# Patient Record
Sex: Female | Born: 1985 | Race: White | Hispanic: No | Smoking: Current every day smoker
Health system: Southern US, Community
[De-identification: ages and names within clinical notes are randomized; demographics above are authoritative.]

## PROBLEM LIST (undated history)

## (undated) DIAGNOSIS — G44209 Tension-type headache, unspecified, not intractable: Secondary | ICD-10-CM

---
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
## 2006-03-31 ENCOUNTER — Emergency Department (HOSPITAL_COMMUNITY): Admission: EM | Admit: 2006-03-31 | Discharge: 2006-03-31 | Payer: Self-pay | Admitting: Emergency Medicine

## 2006-11-14 ENCOUNTER — Ambulatory Visit: Payer: Self-pay | Admitting: Family Medicine

## 2006-12-19 ENCOUNTER — Ambulatory Visit: Payer: Self-pay | Admitting: Family Medicine

## 2007-04-24 ENCOUNTER — Encounter: Payer: Self-pay | Admitting: Family Medicine

## 2007-04-24 ENCOUNTER — Ambulatory Visit: Payer: Self-pay | Admitting: Family Medicine

## 2007-05-08 ENCOUNTER — Ambulatory Visit: Payer: Self-pay | Admitting: Obstetrics & Gynecology

## 2007-06-15 ENCOUNTER — Emergency Department (HOSPITAL_COMMUNITY): Admission: EM | Admit: 2007-06-15 | Discharge: 2007-06-15 | Payer: Self-pay | Admitting: Emergency Medicine

## 2007-07-10 ENCOUNTER — Ambulatory Visit: Payer: Self-pay | Admitting: Family Medicine

## 2007-07-10 ENCOUNTER — Encounter (INDEPENDENT_AMBULATORY_CARE_PROVIDER_SITE_OTHER): Payer: Self-pay | Admitting: Internal Medicine

## 2007-07-10 DIAGNOSIS — R519 Headache, unspecified: Secondary | ICD-10-CM | POA: Insufficient documentation

## 2007-07-10 DIAGNOSIS — F411 Generalized anxiety disorder: Secondary | ICD-10-CM | POA: Insufficient documentation

## 2007-07-10 DIAGNOSIS — K2990 Gastroduodenitis, unspecified, without bleeding: Secondary | ICD-10-CM

## 2007-07-10 DIAGNOSIS — N3000 Acute cystitis without hematuria: Secondary | ICD-10-CM

## 2007-07-10 DIAGNOSIS — K2970 Gastritis, unspecified, without bleeding: Secondary | ICD-10-CM | POA: Insufficient documentation

## 2007-07-10 DIAGNOSIS — R51 Headache: Secondary | ICD-10-CM

## 2007-07-10 DIAGNOSIS — F329 Major depressive disorder, single episode, unspecified: Secondary | ICD-10-CM

## 2007-07-11 LAB — CONVERTED CEMR LAB
Basophils Absolute: 0 10*3/uL (ref 0.0–0.1)
Basophils Relative: 0.6 % (ref 0.0–1.0)
Eosinophils Absolute: 0.1 10*3/uL (ref 0.0–0.6)
Eosinophils Relative: 0.8 % (ref 0.0–5.0)
H Pylori IgG: NEGATIVE
HCT: 38.9 % (ref 36.0–46.0)
Hemoglobin: 13.3 g/dL (ref 12.0–15.0)
Lymphocytes Relative: 33.5 % (ref 12.0–46.0)
MCHC: 34.1 g/dL (ref 30.0–36.0)
MCV: 87.2 fL (ref 78.0–100.0)
Monocytes Absolute: 0.6 10*3/uL (ref 0.2–0.7)
Monocytes Relative: 8.7 % (ref 3.0–11.0)
Neutro Abs: 3.9 10*3/uL (ref 1.4–7.7)
Neutrophils Relative %: 56.4 % (ref 43.0–77.0)
Platelets: 288 10*3/uL (ref 150–400)
RBC: 4.46 M/uL (ref 3.87–5.11)
RDW: 12 % (ref 11.5–14.6)
WBC: 6.9 10*3/uL (ref 4.5–10.5)

## 2007-07-20 ENCOUNTER — Ambulatory Visit: Payer: Self-pay | Admitting: Family Medicine

## 2007-07-20 DIAGNOSIS — B029 Zoster without complications: Secondary | ICD-10-CM | POA: Insufficient documentation

## 2007-08-07 ENCOUNTER — Ambulatory Visit: Payer: Self-pay | Admitting: Family Medicine

## 2007-08-14 DIAGNOSIS — J020 Streptococcal pharyngitis: Secondary | ICD-10-CM

## 2007-08-16 ENCOUNTER — Ambulatory Visit: Payer: Self-pay | Admitting: Internal Medicine

## 2007-08-16 LAB — CONVERTED CEMR LAB: Rapid Strep: POSITIVE

## 2007-10-03 ENCOUNTER — Ambulatory Visit: Payer: Self-pay | Admitting: Family Medicine

## 2007-10-03 DIAGNOSIS — K120 Recurrent oral aphthae: Secondary | ICD-10-CM | POA: Insufficient documentation

## 2007-10-03 DIAGNOSIS — I7300 Raynaud's syndrome without gangrene: Secondary | ICD-10-CM | POA: Insufficient documentation

## 2007-10-16 ENCOUNTER — Ambulatory Visit: Payer: Self-pay | Admitting: Family Medicine

## 2007-11-12 ENCOUNTER — Ambulatory Visit: Payer: Self-pay | Admitting: Family Medicine

## 2007-11-12 ENCOUNTER — Ambulatory Visit (HOSPITAL_COMMUNITY): Admission: RE | Admit: 2007-11-12 | Discharge: 2007-11-12 | Payer: Self-pay | Admitting: Family Medicine

## 2007-12-04 ENCOUNTER — Ambulatory Visit: Payer: Self-pay | Admitting: Family Medicine

## 2008-02-08 ENCOUNTER — Ambulatory Visit: Payer: Self-pay | Admitting: Family Medicine

## 2008-02-08 LAB — CONVERTED CEMR LAB
Beta hcg, urine, semiquantitative: NEGATIVE
Bilirubin Urine: NEGATIVE
Blood in Urine, dipstick: NEGATIVE
Glucose, Urine, Semiquant: NEGATIVE
Ketones, urine, test strip: NEGATIVE
Nitrite: NEGATIVE
Specific Gravity, Urine: 1.025
Urobilinogen, UA: 0.2
WBC Urine, dipstick: NEGATIVE
pH: 5

## 2008-02-09 ENCOUNTER — Encounter (INDEPENDENT_AMBULATORY_CARE_PROVIDER_SITE_OTHER): Payer: Self-pay | Admitting: Internal Medicine

## 2008-05-21 ENCOUNTER — Ambulatory Visit: Payer: Self-pay | Admitting: Family Medicine

## 2009-06-22 ENCOUNTER — Emergency Department (HOSPITAL_COMMUNITY): Admission: EM | Admit: 2009-06-22 | Discharge: 2009-06-22 | Payer: Self-pay | Admitting: Emergency Medicine

## 2009-06-24 ENCOUNTER — Inpatient Hospital Stay (HOSPITAL_COMMUNITY): Admission: AD | Admit: 2009-06-24 | Discharge: 2009-06-24 | Payer: Self-pay | Admitting: Obstetrics & Gynecology

## 2010-03-03 ENCOUNTER — Emergency Department: Payer: Self-pay | Admitting: Emergency Medicine

## 2010-12-09 LAB — POCT URINALYSIS DIP (DEVICE)
Bilirubin Urine: NEGATIVE
Glucose, UA: NEGATIVE mg/dL
Hgb urine dipstick: NEGATIVE
Ketones, ur: NEGATIVE mg/dL
Nitrite: NEGATIVE
Protein, ur: NEGATIVE mg/dL
Specific Gravity, Urine: 1.02 (ref 1.005–1.030)
Urobilinogen, UA: 0.2 mg/dL (ref 0.0–1.0)
pH: 7.5 (ref 5.0–8.0)

## 2010-12-09 LAB — DIFFERENTIAL
Basophils Relative: 1 % (ref 0–1)
Lymphocytes Relative: 26 % (ref 12–46)
Monocytes Absolute: 0.7 10*3/uL (ref 0.1–1.0)
Monocytes Relative: 11 % (ref 3–12)
Neutro Abs: 4.2 10*3/uL (ref 1.7–7.7)

## 2010-12-09 LAB — GC/CHLAMYDIA PROBE AMP, GENITAL: GC Probe Amp, Genital: NEGATIVE

## 2010-12-09 LAB — URINALYSIS, ROUTINE W REFLEX MICROSCOPIC
Bilirubin Urine: NEGATIVE
Hgb urine dipstick: NEGATIVE
Specific Gravity, Urine: <1.005 — ABNORMAL LOW (ref 1.005–1.030)
pH: 6 (ref 5.0–8.0)

## 2010-12-09 LAB — CBC
HCT: 41.9 % (ref 36.0–46.0)
Hemoglobin: 14.3 g/dL (ref 12.0–15.0)
MCHC: 34 g/dL (ref 30.0–36.0)
RBC: 4.75 MIL/uL (ref 3.87–5.11)

## 2010-12-09 LAB — WET PREP, GENITAL: Clue Cells Wet Prep HPF POC: NONE SEEN

## 2011-01-18 NOTE — Assessment & Plan Note (Signed)
NAME:  TYTIANNA, GREENLEY NO.:  [id number]   MEDICAL RECORD NO.:  [id number]          PATIENT TYPE:  POB   LOCATION:  [facility] at [facility]         FACILITY:  [facility]   PHYSICIAN:  Elsie Lincoln, MD      DATE OF BIRTH:  March 28, 1986   DATE OF SERVICE:  08/07/2007                                  CLINIC NOTE   The patient is a 25 year old female who presents for follow-up pelvic  pain.  The pelvic pain is during her menses which she finds  excruciating. She has now changed to Fem-con. However, she is not on  continuous. She does take the placebo that she has a period.  She does  have a with a pain with defecation.  It sounds like she may be mildly  constipated. We talked about Colace, docusate sodium and  Citrucel/Fibercon.  This would help soften her bowel movements and maybe  aid in relieving some of her pelvic pain.  She does not have any urinary  symptoms.  She does have any pain with intercourse. Again, this pain is  mostly during menses.   PHYSICAL EXAM:  The vagina is clean.  Cervix closed.  Uterus very small,  anteverted, mobile. The patient has a little more tenderness left than  right on the adnexa. However, there is no discrete mass felt.   ASSESSMENT/PLAN:  A 25 year old female with pelvic pain.  The patient  questionably has endometriosis.  I would treat her with continuous OCPs.  She did have endometriosis flow.  We had talked about the risk of  surgery versus just trying continuous OCPs. She agrees to try this for 2  months.  If she does not have relief during these two months, we will go  ahead schedule a diagnostic laparoscopy with myself or Dr. Shawnie Pons. She  understands the risk of this procedure bleeding, infection, damage to  intra-abdominal organs. The patient is to come back in 2 months for  follow-up.           ______________________________  Elsie Lincoln, MD     KL/MEDQ  D:  08/07/2007  T:  08/07/2007  Job:  [id number]
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
## 2011-01-21 NOTE — Assessment & Plan Note (Signed)
Angela Hale, LESH                ACCOUNT NO.:  [id number]   MEDICAL RECORD NO.:  [id number]          PATIENT TYPE:  POB   LOCATION:  [facility] at [facility]         FACILITY:  [facility]   PHYSICIAN:  Tinnie Gens, MD        DATE OF BIRTH:  1986-08-12   DATE OF SERVICE:  12/29/2006                                  CLINIC NOTE   CHIEF COMPLAINT:  Followup.   HISTORY OF PRESENT ILLNESS:  The patient is a 25 year old nulligravida  who was seen previously March 11 for pelvic pain.  She had a right  ovarian cyst at the time and some abnormal uterine bleeding.  She was  switched from a low dose contraceptive to Ortho-Cept to 30 mcg pill,  monophasic pill.  She had been on Ortho-Cept for approximately 1 month.  She has had 9 days of bleeding, mostly brownish discharge.  She does not  really have a full cycle yet and she is on day 2 of her placebos.  This  is her first pack.  She has decreased pelvic pain.  She has been using  Darvocet, which has helped.   VITAL SIGNS:  Vitals as noted in the chart.  Blood pressure is up mildly  at 140/78, weight 134 and stable.  GENERAL:  She is well developed, well nourished female in no acute  distress.  GU: Normal external female genitalia.  Vagina pink and rugated.  BUS is  normal.  Cervix was nulliparous without lesion.  Pink-brown discharge is  noted.  Quick transvaginal ultrasound was done, which revealed normal  ovaries bilaterally and normal appearing uterus with a slightly  thickened endometrium at the fundus.  She appears to have a normal  endometrial stripe, it was felt was just because her cycle is on its  way.   IMPRESSION:  1. History of right-sided ovarian cyst, resolved now.  2. Intrauterine bleeding, have tried different pill pack and will try      it for 1 more month.  If it fails to work we will switch her to      something else, may be a candidate for a NuvaRing or a 137      monophasic pill.           ______________________________  Tinnie Gens, MD     TP/MEDQ  D:  12/19/2006  T:  12/19/2006  Job:  [id number]

## 2011-05-30 LAB — CBC
Hemoglobin: 13.5
MCHC: 35.1
MCV: 84.9
RBC: 4.52

## 2013-10-19 ENCOUNTER — Encounter (HOSPITAL_COMMUNITY): Payer: Self-pay | Admitting: Emergency Medicine

## 2013-10-19 ENCOUNTER — Emergency Department (HOSPITAL_COMMUNITY)
Admission: EM | Admit: 2013-10-19 | Discharge: 2013-10-19 | Disposition: A | Discharge status: Home / Self Care | Payer: Self-pay | Attending: Emergency Medicine | Admitting: Emergency Medicine

## 2013-10-19 ENCOUNTER — Emergency Department (HOSPITAL_COMMUNITY): Payer: Self-pay

## 2013-10-19 DIAGNOSIS — N939 Abnormal uterine and vaginal bleeding, unspecified: Secondary | ICD-10-CM

## 2013-10-19 DIAGNOSIS — F17200 Nicotine dependence, unspecified, uncomplicated: Secondary | ICD-10-CM | POA: Insufficient documentation

## 2013-10-19 DIAGNOSIS — Z7982 Long term (current) use of aspirin: Secondary | ICD-10-CM | POA: Insufficient documentation

## 2013-10-19 DIAGNOSIS — Z3202 Encounter for pregnancy test, result negative: Secondary | ICD-10-CM | POA: Insufficient documentation

## 2013-10-19 DIAGNOSIS — N898 Other specified noninflammatory disorders of vagina: Secondary | ICD-10-CM | POA: Insufficient documentation

## 2013-10-19 LAB — POCT PREGNANCY, URINE: PREG TEST UR: NEGATIVE

## 2013-10-19 LAB — URINALYSIS, ROUTINE W REFLEX MICROSCOPIC
BILIRUBIN URINE: NEGATIVE
Glucose, UA: NEGATIVE mg/dL
KETONES UR: NEGATIVE mg/dL
NITRITE: NEGATIVE
Protein, ur: NEGATIVE mg/dL
SPECIFIC GRAVITY, URINE: 1.004 — AB (ref 1.005–1.030)
UROBILINOGEN UA: 0.2 mg/dL (ref 0.0–1.0)
pH: 6 (ref 5.0–8.0)

## 2013-10-19 LAB — POCT I-STAT, CHEM 8
BUN: 6 mg/dL (ref 6–23)
CALCIUM ION: 1.28 mmol/L — AB (ref 1.12–1.23)
CHLORIDE: 101 meq/L (ref 96–112)
CREATININE: 0.7 mg/dL (ref 0.50–1.10)
Glucose, Bld: 69 mg/dL — ABNORMAL LOW (ref 70–99)
HCT: 50 % — ABNORMAL HIGH (ref 36.0–46.0)
Hemoglobin: 17 g/dL — ABNORMAL HIGH (ref 12.0–15.0)
Potassium: 3.6 mEq/L — ABNORMAL LOW (ref 3.7–5.3)
SODIUM: 142 meq/L (ref 137–147)
TCO2: 27 mmol/L (ref 0–100)

## 2013-10-19 LAB — CBC
HCT: 46.5 % — ABNORMAL HIGH (ref 36.0–46.0)
Hemoglobin: 16.1 g/dL — ABNORMAL HIGH (ref 12.0–15.0)
MCH: 29.1 pg (ref 26.0–34.0)
MCHC: 34.6 g/dL (ref 30.0–36.0)
MCV: 84.1 fL (ref 78.0–100.0)
PLATELETS: 332 10*3/uL (ref 150–400)
RBC: 5.53 MIL/uL — AB (ref 3.87–5.11)
RDW: 12.9 % (ref 11.5–15.5)
WBC: 9.7 10*3/uL (ref 4.0–10.5)

## 2013-10-19 LAB — URINE MICROSCOPIC-ADD ON

## 2013-10-19 LAB — WET PREP, GENITAL
CLUE CELLS WET PREP: NONE SEEN
TRICH WET PREP: NONE SEEN
WBC WET PREP: NONE SEEN
YEAST WET PREP: NONE SEEN

## 2013-10-19 NOTE — Discharge Instructions (Signed)
Abnormal Uterine Bleeding Abnormal uterine bleeding means bleeding from the vagina that is not your normal menstrual period. This can be:  Bleeding or spotting between periods.  Bleeding after sex (sexual intercourse).  Bleeding that is heavier or more than normal.  Periods that last longer than usual.  Bleeding after menopause. There are many problems that may cause this. Treatment will depend on the cause of the bleeding. Any kind of bleeding that is not normal should be reviewed by your doctor.  HOME CARE Watch your condition for any changes. These actions may lessen any discomfort you are having:  Do not use tampons or douches as told by your doctor.  Change your pads often. You should get regular pelvic exams and Pap tests. Keep all appointments for tests as told by your doctor. GET HELP IF:  You are bleeding for more than 1 week.  You feel dizzy at times. GET HELP RIGHT AWAY IF:   You pass out.  You have to change pads every 15 to 30 minutes.  You have belly pain.  You have a fever.  You become sweaty or weak.  You are passing large blood clots from the vagina.  You feel sick to your stomach (nauseous) and throw up (vomit). MAKE SURE YOU:  Understand these instructions.  Will watch your condition.  Will get help right away if you are not doing well or get worse. Document Released: 06/19/2009 Document Revised: 06/12/2013 Document Reviewed: 03/21/2013 [employer] Patient Information 2014 [employer].  

## 2013-10-19 NOTE — ED Provider Notes (Signed)
CSN: [id number]     Arrival date & time 10/19/13  1452 History   First MD Initiated Contact with Patient 10/19/13 1516     Chief Complaint  Patient presents with  . Pelvic Pain  . Vaginal Bleeding     (Consider location/radiation/quality/duration/timing/severity/associated sxs/prior Treatment) HPI Comments: Patient is a 28 year old G0P0 female who presents to the emergency department complaining of vaginal bleeding and pelvic pain x2 days. Patient states she has had intermittent spotting for the past few months since starting on the Depo shot, however last night she began to have severe vaginal bleeding. States she has been using one tampon every 30 minutes with a large amount of blood, she has noticed "dime size" clots. Admits to associated pelvic pain that she describes as "radiating across her pelvis". No aggravating or alleviating factors. Denies vaginal discharge, dyspareunia, increased urinary frequency, urgency, dysuria, fever or chills, lightheadedness or weakness. States she's been occasionally nauseated. No vomiting. She is sexually active with one partner. She cannot recall when her last menstrual cycle was since she has not had a normal period since beginning depo. Recently had an abnormal Pap-smear done by the health dept, however pt never followed up on this.  Patient is a 28 y.o. female presenting with pelvic pain and vaginal bleeding. The history is provided by the patient.  Pelvic Pain  Vaginal Bleeding   History reviewed. No pertinent past medical history. History reviewed. No pertinent past surgical history. No family history on file. History  Substance Use Topics  . Smoking status: Current Every Day Smoker -- 0.25 packs/day for 5 years    Types: Cigarettes  . Smokeless tobacco: Never Used  . Alcohol Use: Yes     Comment: Socially    OB History   Grav Para Term Preterm Abortions TAB SAB Ect Mult Living                 Review of Systems  Genitourinary: Positive  for vaginal bleeding and pelvic pain.  All other systems reviewed and are negative.      Allergies  Sumatriptan  Home Medications   Current Outpatient Rx  Name  Route  Sig  Dispense  Refill  . aspirin 81 MG tablet   Oral   Take 81 mg by mouth daily.         . medroxyPROGESTERone (DEPO-PROVERA) 150 MG/ML injection   Intramuscular   Inject 150 mg into the muscle every 3 (three) months.          BP 140/104  Pulse 76  Temp(Src) 98.2 F (36.8 C) (Oral)  Resp 18  SpO2 100% Physical Exam  Nursing note and vitals reviewed. Constitutional: She is oriented to person, place, and time. She appears well-developed and well-nourished. No distress.  HENT:  Head: Normocephalic and atraumatic.  Mouth/Throat: Oropharynx is clear and moist.  Eyes: Conjunctivae are normal.  Neck: Normal range of motion. Neck supple.  Cardiovascular: Normal rate, regular rhythm, normal heart sounds and intact distal pulses.   Pulmonary/Chest: Effort normal and breath sounds normal.  Abdominal: Soft. Normal appearance and bowel sounds are normal. She exhibits no distension and no mass. There is tenderness (mild). There is no rigidity, no rebound and no guarding.    No peritoneal signs.  Genitourinary: Uterus is tender. Cervix exhibits no motion tenderness, no discharge and no friability. Right adnexum displays tenderness (mild). Right adnexum displays no mass and no fullness. Left adnexum displays no mass, no tenderness and no fullness. There is bleeding around  the vagina. No tenderness around the vagina. No signs of injury around the vagina. No vaginal discharge found.  Musculoskeletal: Normal range of motion. She exhibits no edema.  Neurological: She is alert and oriented to person, place, and time.  Skin: Skin is warm and dry. She is not diaphoretic.  Psychiatric: She has a normal mood and affect. Her behavior is normal.    ED Course  Procedures (including critical care time) Labs Review Labs  Reviewed  CBC - Abnormal; Notable for the following:    RBC 5.53 (*)    Hemoglobin 16.1 (*)    HCT 46.5 (*)    All other components within normal limits  URINALYSIS, ROUTINE W REFLEX MICROSCOPIC - Abnormal; Notable for the following:    Specific Gravity, Urine 1.004 (*)    Hgb urine dipstick LARGE (*)    Leukocytes, UA TRACE (*)    All other components within normal limits  POCT I-STAT, CHEM 8 - Abnormal; Notable for the following:    Potassium 3.6 (*)    Glucose, Bld 69 (*)    Calcium, Ion 1.28 (*)    Hemoglobin 17.0 (*)    HCT 50.0 (*)    All other components within normal limits  WET PREP, GENITAL  GC/CHLAMYDIA PROBE AMP  URINE MICROSCOPIC-ADD ON  POCT PREGNANCY, URINE   Imaging Review US Transvaginal Non-ob  10/19/2013   CLINICAL DATA:  Pelvic pain and vaginal bleeding  EXAM: TRANSABDOMINAL AND TRANSVAGINAL ULTRASOUND OF PELVIS  TECHNIQUE: Both transabdominal and transvaginal ultrasound examinations of the pelvis were performed. Transabdominal technique was performed for global imaging of the pelvis including uterus, ovaries, adnexal regions, and pelvic cul-de-sac. It was necessary to proceed with endovaginal exam following the transabdominal exam to visualize the endometrium and ovaries.  COMPARISON:  US TRANSVAGINAL NON-OB dated 06/24/2009  FINDINGS: Uterus  Measurements: 6.7 x 3.7 x 3.0 cm, anteverted, anteflexed. No fibroids or other mass visualized.  Endometrium  Thickness: 3 mm.  No focal abnormality visualized.  Right ovary  Measurements: 4.6 x 1.8 x 1.6 cm. Normal appearance/no adnexal mass.  Left ovary  Measurements: 3.7 x 1.6 x 1.1 cm. Normal appearance/no adnexal mass.  Other findings  Trace free fluid is noted.  IMPRESSION: Normal exam.   Electronically Signed   By: Christiana Pellant M.D.   On: 10/19/2013 17:24   US Pelvis Complete  10/19/2013   CLINICAL DATA:  Pelvic pain and vaginal bleeding  EXAM: TRANSABDOMINAL AND TRANSVAGINAL ULTRASOUND OF PELVIS  TECHNIQUE: Both  transabdominal and transvaginal ultrasound examinations of the pelvis were performed. Transabdominal technique was performed for global imaging of the pelvis including uterus, ovaries, adnexal regions, and pelvic cul-de-sac. It was necessary to proceed with endovaginal exam following the transabdominal exam to visualize the endometrium and ovaries.  COMPARISON:  US TRANSVAGINAL NON-OB dated 06/24/2009  FINDINGS: Uterus  Measurements: 6.7 x 3.7 x 3.0 cm, anteverted, anteflexed. No fibroids or other mass visualized.  Endometrium  Thickness: 3 mm.  No focal abnormality visualized.  Right ovary  Measurements: 4.6 x 1.8 x 1.6 cm. Normal appearance/no adnexal mass.  Left ovary  Measurements: 3.7 x 1.6 x 1.1 cm. Normal appearance/no adnexal mass.  Other findings  Trace free fluid is noted.  IMPRESSION: Normal exam.   Electronically Signed   By: Christiana Pellant M.D.   On: 10/19/2013 17:24    EKG Interpretation   None       MDM   Final diagnoses:  Vaginal bleeding   Pt presenting with vaginal  bleeding, pelvic pain. She is well appearing, NAD. Hemodynamically stable. Large amount of bleeding on pelvic exam, mild tenderness right adnexa. Labs pending- cbc, chem 8, wet prep, gc/chlamydia, ua, urine preg. Will obtain pelvic US. 5:34 PM Labs about any acute finding. Pelvic ultrasound normal. I advised patient to followup with gynecology. Stable for d/c. Return precautions given. Patient states understanding of treatment care plan and is agreeable.    Trevor Mace, PA-C 10/19/13 1735

## 2013-10-19 NOTE — ED Notes (Signed)
Pt reports lower abdominal/pelvic pain that began two days ago. Pt reports vaginal bleeding began last night, however she reports "spotting for months." Pt reports using "one super tampon every thirty minutes." Pt reports dime sized clots. Pt denies vaginal discharge. Pt is A/O x4 and in NAD.

## 2013-10-21 LAB — GC/CHLAMYDIA PROBE AMP
CT Probe RNA: NEGATIVE
GC PROBE AMP APTIMA: NEGATIVE

## 2013-10-22 NOTE — ED Provider Notes (Signed)
Medical screening examination/treatment/procedure(s) were performed by non-physician practitioner and as supervising physician I was immediately available for consultation/collaboration.  EKG Interpretation   None         Candyce ChurnJohn David Ysabella Babiarz III, MD 10/22/13 1010

## 2014-10-16 ENCOUNTER — Emergency Department (HOSPITAL_COMMUNITY)
Admission: EM | Admit: 2014-10-16 | Discharge: 2014-10-16 | Disposition: A | Discharge status: Home / Self Care | Payer: Self-pay | Attending: Emergency Medicine | Admitting: Emergency Medicine

## 2014-10-16 ENCOUNTER — Emergency Department (HOSPITAL_COMMUNITY): Payer: Self-pay

## 2014-10-16 ENCOUNTER — Encounter (HOSPITAL_COMMUNITY): Payer: Self-pay | Admitting: Emergency Medicine

## 2014-10-16 DIAGNOSIS — Z793 Long term (current) use of hormonal contraceptives: Secondary | ICD-10-CM | POA: Insufficient documentation

## 2014-10-16 DIAGNOSIS — Y998 Other external cause status: Secondary | ICD-10-CM | POA: Insufficient documentation

## 2014-10-16 DIAGNOSIS — M25522 Pain in left elbow: Secondary | ICD-10-CM

## 2014-10-16 DIAGNOSIS — S52125A Nondisplaced fracture of head of left radius, initial encounter for closed fracture: Secondary | ICD-10-CM | POA: Insufficient documentation

## 2014-10-16 DIAGNOSIS — S52122A Displaced fracture of head of left radius, initial encounter for closed fracture: Secondary | ICD-10-CM

## 2014-10-16 DIAGNOSIS — Y9389 Activity, other specified: Secondary | ICD-10-CM | POA: Insufficient documentation

## 2014-10-16 DIAGNOSIS — S59902A Unspecified injury of left elbow, initial encounter: Secondary | ICD-10-CM | POA: Insufficient documentation

## 2014-10-16 DIAGNOSIS — Y9289 Other specified places as the place of occurrence of the external cause: Secondary | ICD-10-CM | POA: Insufficient documentation

## 2014-10-16 DIAGNOSIS — W010XXA Fall on same level from slipping, tripping and stumbling without subsequent striking against object, initial encounter: Secondary | ICD-10-CM | POA: Insufficient documentation

## 2014-10-16 DIAGNOSIS — M25512 Pain in left shoulder: Secondary | ICD-10-CM

## 2014-10-16 DIAGNOSIS — Z7982 Long term (current) use of aspirin: Secondary | ICD-10-CM | POA: Insufficient documentation

## 2014-10-16 DIAGNOSIS — Z720 Tobacco use: Secondary | ICD-10-CM | POA: Insufficient documentation

## 2014-10-16 MED ORDER — IBUPROFEN 800 MG PO TABS: 800.0000 mg | ORAL_TABLET | Freq: Three times a day (TID) | ORAL | Status: DC

## 2014-10-16 MED ORDER — HYDROCODONE-ACETAMINOPHEN 5-325 MG PO TABS: 1.0000 | ORAL_TABLET | Freq: Four times a day (QID) | ORAL | Status: DC | PRN

## 2014-10-16 NOTE — Progress Notes (Signed)
Orthopedic Tech Progress Note Patient Details:  Margaretmary EddySonia D Grey 02-19-1986 [id number]  Ortho Devices Type of Ortho Device: Ace wrap, Post (long arm) splint, Arm sling Ortho Device/Splint Location: LUE Ortho Device/Splint Interventions: Ordered, Application   Jennye MoccasinHughes, Wynnie Pacetti Craig 10/16/2014, 5:21 PM

## 2014-10-16 NOTE — ED Provider Notes (Signed)
CSN: [id number]     Arrival date & time 10/16/14  1512 History  This chart was scribed for non-physician practitioner, Ladona MowJoe Eyal Greenhaw, PA-C, working with Elwin MochaBlair Walden, MD by Milly JakobJohn Lee Graves, ED Scribe. The patient was seen in room TR06C/TR06C. Patient's care was started at 3:43 PM.   Chief Complaint  Patient presents with  . Arm Injury   The history is provided by the patient. No language interpreter was used.   HPI Comments: Margaretmary EddySonia D Hale is a 29 y.o. female who presents to the Emergency Department complaining of an injury to her left arm when she tripped over a stump and landed on her elbow. She reports constant, aching, pain in her left elbow and states that it feels "locked". She additionally reports left shoulder pain that is exacerbated by movement. She denies LOC or head injury. She denies numbness or weakness. She reports an allergy to Sumatriptan.   History reviewed. No pertinent past medical history. History reviewed. No pertinent past surgical history. No family history on file. History  Substance Use Topics  . Smoking status: Current Every Day Smoker -- 0.25 packs/day for 5 years    Types: Cigarettes  . Smokeless tobacco: Never Used  . Alcohol Use: Yes     Comment: Socially    OB History    No data available     Review of Systems  Constitutional: Negative for fever and chills.  Musculoskeletal: Positive for arthralgias (left elbow and shoulder).  Neurological: Negative for weakness and numbness.   Allergies  Sumatriptan  Home Medications   Prior to Admission medications   Medication Sig Start Date End Date Taking? Authorizing Provider  aspirin 81 MG tablet Take 81 mg by mouth daily.    Historical Provider, MD  HYDROcodone-acetaminophen (NORCO/VICODIN) 5-325 MG per tablet Take 1-2 tablets by mouth every 6 (six) hours as needed for moderate pain or severe pain. 10/16/14   Monte FantasiaJoseph W Ravenna Legore, PA-C  ibuprofen (ADVIL,MOTRIN) 800 MG tablet Take 1 tablet (800 mg total) by mouth 3  (three) times daily. 10/16/14   Monte FantasiaJoseph W Jaevon Paras, PA-C  medroxyPROGESTERone (DEPO-PROVERA) 150 MG/ML injection Inject 150 mg into the muscle every 3 (three) months.    Historical Provider, MD   Triage Vitals: BP 132/86 mmHg  Pulse 81  Temp(Src) 97.5 F (36.4 C) (Oral)  Resp 16  Ht 5\' 7"  (1.702 m)  Wt 131 lb (59.421 kg)  BMI 20.51 kg/m2  SpO2 99%  LMP 10/09/2014 Physical Exam  Constitutional: She is oriented to person, place, and time. She appears well-developed and well-nourished. No distress.  HENT:  Head: Normocephalic and atraumatic.  Eyes: Conjunctivae and EOM are normal. Pupils are equal, round, and reactive to light.  Neck: Neck supple. No tracheal deviation present.  Cardiovascular: Normal rate, regular rhythm, normal heart sounds and intact distal pulses.   No murmur heard. Pulses:      Radial pulses are 2+ on the right side, and 2+ on the left side.  Pulmonary/Chest: Effort normal. No respiratory distress.  Musculoskeletal: Normal range of motion.  Tenderness to palpation of medial and lateral aspect of left elbow. 90 degrees of flexion, 210 degrees of extension w/o pain. Tenderness to lateral boarder of left scapula. 90 degrees of flexion and abduction of shoulder. Internal rotation to the T5-T6 level. Distal sensation intact.   Neurological: She is alert and oriented to person, place, and time.  Skin: Skin is warm and dry.  Psychiatric: She has a normal mood and affect. Her behavior is  normal.  Nursing note and vitals reviewed.   ED Course  Procedures (including critical care time) DIAGNOSTIC STUDIES: Oxygen Saturation is 99% on room air, normal by my interpretation.    COORDINATION OF CARE: 3:47 PM-Discussed treatment plan which includes left elbow and shoulder X-rays with pt at bedside and pt agreed to plan.   Labs Review Labs Reviewed - No data to display  Imaging Review Dg Elbow Complete Left  10/16/2014   CLINICAL DATA:  Trip and fall over tree stump last  night and landed on concrete, abrasion to chin with bruising to lateral elbow, difficulty moving shoulder and elbow  EXAM: LEFT ELBOW - COMPLETE 3+ VIEW  COMPARISON:  None.  FINDINGS: On 1 oblique view there is a subtle cortical disruption along the anterior, radial margin of radial head which may reflect a nondisplaced fracture. This is not seen on any of the additional views. There is no other evidence of a fracture. Elbow joint is normally spaced and aligned. Lateral view suggests a joint effusion with an elevated anterior fat pad. Soft tissues are unremarkable.  IMPRESSION: 1. Possible nondisplaced fracture of the radial head. Evidence of a joint effusion. No other abnormalities.   Electronically Signed   By: Amie Portland M.D.   On: 10/16/2014 16:45   Dg Shoulder Left  10/16/2014   CLINICAL DATA:  Tripped is with fall and difficulty moving shoulder and elbow. Initial encounter.  EXAM: LEFT SHOULDER - 2+ VIEW  COMPARISON:  None.  FINDINGS: There is no evidence of fracture or dislocation. There is no evidence of arthropathy. 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  IMPRESSION: No acute osseous findings.   Electronically Signed   By: Marnee Spring M.D.   On: 10/16/2014 16:46     EKG Interpretation None      MDM   Final diagnoses:  Elbow pain, left  Shoulder pain, left  Radial head fracture, left, closed, initial encounter    Patient here with left elbow pain and left shoulder pain status post fall last night. Patient with limited range of motion due to pain. Patient neurovascularly intact. Radiographs remarkable for a possible nondisplaced fracture of the radial head with evidence of joint effusion to support this. Patient's shoulder radiographs unremarkable for any acute osseous findings. Patient to be placed in posterior splint, and given sling for shoulder support. RICE therapy discussed  with patient, and patient strongly encouraged to follow-up with orthopedics regarding her injury. I discussed return precautions with patient, and patient verbalizes understanding and agreement this plan. I encouraged patient to call or return to ER should she have any questions or concerns.  I personally performed the services described in this documentation, which was scribed in my presence. The recorded information has been reviewed and is accurate.  BP 132/86 mmHg  Pulse 81  Temp(Src) 97.5 F (36.4 C) (Oral)  Resp 16  Ht  (1.702 m)  Wt 131 lb (59.421 kg)  BMI 20.51 kg/m2  SpO2 99%  LMP 10/09/2014  Signed,  Ladona Mow, PA-C 5:17 PM  Patient's care and plan discussed with Dr. Jerelyn Scott, M.D.  Monte Fantasia, PA-C 10/16/14 1717  Ethelda Chick, MD 10/16/14 [phone]

## 2014-10-16 NOTE — Discharge Instructions (Signed)
Follow-up with orthopedics regarding your elbow and shoulder.    Radial Head Fracture A radial head fracture is a break of the smaller bone (radius) in the forearm. The head of this bone is the part near the elbow. These fractures commonly happen during a fall, when you land on an outstretched arm. These fractures are more common in middle aged adults and are common with a dislocation of the elbow. SYMPTOMS   Swelling of the elbow joint and pain on the outside of the elbow.  Pain and difficulty in bending or straightening the elbow.  Pain and difficulty in turning the palm of the hand up or down with the elbow bent. DIAGNOSIS  Your caregiver may make this diagnosis by a physical exam. X-rays can confirm the type and amount of fracture. Sometimes a fracture that is not displaced cannot be seen on the original X-ray. TREATMENT  Radial head fractures are classified according to the amount of movement (displacement) of parts from the normal position.  Type 1 Fractures  Type 1 fractures are generally small fractures in which bone pieces remain together (nondisplaced fracture).  The fracture may not be seen on initial X-rays. Usually if X-rays are repeated two to three weeks later, the fracture will show up. A splint or sling is used for a few days. Gentle early motion is used to prevent the elbow from becoming stiff. It should not be done vigorously or forced as this could displace the bone pieces. Type 2 Fractures  With type 2 fractures, bone pieces are slightly displaced and larger pieces of bone are broken off.  If only a little displacement of the bone piece is present, splinting for 4 to 5 days usually works well. This is again followed with gentle active range of motion. Small fragments may be surgically removed.  Large pieces of bone that can be put back into place will sometimes be fixed with pins or screws to hold them until the bone is healed. If this cannot be done, the fragments are  removed. For older, less active people, sometimes the entire radial head is removed if the wrist is not injured. The elbow and arm will still work fine. Soft tissue, tendon, and ligament injuries are corrected at the same time. Type 3 Fractures  Type 3 fractures have multiple broken pieces of bone that cannot be fixed. Surgery is usually needed to remove the broken bits of bone and what is left of the radial head. Soft-tissue damage is repaired. Gentle early motion is used to prevent the elbow from becoming stiff. Sometimes an artificial radial head can be used to prevent deformity if the elbow is unstable. Rest, ice, elevation, immobilization, medications, and pain control are used in the early care. HOME CARE INSTRUCTIONS   Keep the injured part elevated while sitting or lying down. Keep the injury above the level of your heart (the center of the chest). This will decrease swelling and pain.  Apply ice to the injury for 15-20 minutes, 03-04 times per day while awake, for 2 days. Put the ice in a plastic bag and place a towel between the bag of ice and your cast or splint.  Move your fingers to avoid stiffness and minimize swelling.  If you have a plaster or fiberglass cast:  Do not try to scratch the skin under the cast using sharp or pointed objects.  Check the skin around the cast every day. You may put lotion on any red or sore areas.  Keep your  cast dry and clean.  If you have a plaster splint:  Wear the splint as directed.  You may loosen the elastic around the splint if your fingers become numb, tingle, or turn cold or blue.  Do not put pressure on any part of your cast or splint. It may break. Rest your cast only on a pillow for the first 24 hours until it is fully hardened.  Your cast or splint can be protected during bathing with a plastic bag. Do not lower the cast or splint into the water.  Only take over-the-counter or prescription medicines for pain, discomfort, or fever  as directed by your caregiver.  Follow all instructions for follow-up with your caregiver. This includes any orthopedic referrals, physical therapy, and rehabilitation. Any delay in obtaining necessary care could result in a delay or failure of the bones to heal or permanent elbow stiffness.  Do not overdo exercises. This could further damage your injury. SEEK IMMEDIATE MEDICAL CARE IF:   Your cast or splint gets damaged or breaks.  You have more severe pain or swelling than you did before getting the cast.  You have severe pain when stretching your fingers.  There is a bad smell, new stains, and/or pus-like (purulent) drainage coming from under the cast.  Your fingers or hand turn pale or blue, become cold, or you lose feeling. Document Released: 06/13/2006 Document Revised: 01/06/2014 Document Reviewed: 07/21/2009 [facility] ([facility] Patient Information 2015 [place], [place]. This information is not intended to replace advice given to you by your health care provider. Make sure you discuss any questions you have with your health care provider.  Shoulder Pain The shoulder is the joint that connects your arms to your body. The bones that form the shoulder joint include the upper arm bone (humerus), the shoulder blade (scapula), and the collarbone (clavicle). The top of the humerus is shaped like a ball and fits into a rather flat socket on the scapula (glenoid cavity). A combination of muscles and strong, fibrous tissues that connect muscles to bones (tendons) support your shoulder joint and hold the ball in the socket. Small, fluid-filled sacs (bursae) are located in different areas of the joint. They act as cushions between the bones and the overlying soft tissues and help reduce friction between the gliding tendons and the bone as you move your arm. Your shoulder joint allows a wide range of motion in your arm. This range of motion allows you to do things like scratch your back or throw a ball. However,  this range of motion also makes your shoulder more prone to pain from overuse and injury. Causes of shoulder pain can originate from both injury and overuse and usually can be grouped in the following four categories:  Redness, swelling, and pain (inflammation) of the tendon (tendinitis) or the bursae (bursitis).  Instability, such as a dislocation of the joint.  Inflammation of the joint (arthritis).  Broken bone (fracture). HOME CARE INSTRUCTIONS   Apply ice to the sore area.  Put ice in a plastic bag.  Place a towel between your skin and the bag.  Leave the ice on for 15-20 minutes, 3-4 times per day for the first 2 days, or as directed by your health care provider.  Stop using cold packs if they do not help with the pain.  If you have a shoulder sling or immobilizer, wear it as long as your caregiver instructs. Only remove it to shower or bathe. Move your arm as little as possible, but  keep your hand moving to prevent swelling.  Squeeze a soft ball or foam pad as much as possible to help prevent swelling.  Only take over-the-counter or prescription medicines for pain, discomfort, or fever as directed by your caregiver. SEEK MEDICAL CARE IF:   Your shoulder pain increases, or new pain develops in your arm, hand, or fingers.  Your hand or fingers become cold and numb.  Your pain is not relieved with medicines. SEEK IMMEDIATE MEDICAL CARE IF:   Your arm, hand, or fingers are numb or tingling.  Your arm, hand, or fingers are significantly swollen or turn white or blue. MAKE SURE YOU:   Understand these instructions.  Will watch your condition.  Will get help right away if you are not doing well or get worse. Document Released: 06/01/2005 Document Revised: 01/06/2014 Document Reviewed: 08/06/2011 [facility] Patient Information 2015 [place], [place]. This information is not intended to replace advice given to you by your health care provider. Make sure you discuss any  questions you have with your health care provider. Cast or Splint Care Casts and splints support injured limbs and keep bones from moving while they heal. It is important to care for your cast or splint at home.  HOME CARE INSTRUCTIONS  Keep the cast or splint uncovered during the drying period. It can take 24 to 48 hours to dry if it is made of plaster. A fiberglass cast will dry in less than 1 hour.  Do not rest the cast on anything harder than a pillow for the first 24 hours.  Do not put weight on your injured limb or apply pressure to the cast until your health care provider gives you permission.  Keep the cast or splint dry. Wet casts or splints can lose their shape and may not support the limb as well. A wet cast that has lost its shape can also create harmful pressure on your skin when it dries. Also, wet skin can become infected.  Cover the cast or splint with a plastic bag when bathing or when out in the rain or snow. If the cast is on the trunk of the body, take sponge baths until the cast is removed.  If your cast does become wet, dry it with a towel or a blow dryer on the cool setting only.  Keep your cast or splint clean. Soiled casts may be wiped with a moistened cloth.  Do not place any hard or soft foreign objects under your cast or splint, such as cotton, toilet paper, lotion, or powder.  Do not try to scratch the skin under the cast with any object. The object could get stuck inside the cast. Also, scratching could lead to an infection. If itching is a problem, use a blow dryer on a cool setting to relieve discomfort.  Do not trim or cut your cast or remove padding from inside of it.  Exercise all joints next to the injury that are not immobilized by the cast or splint. For example, if you have a long leg cast, exercise the hip joint and toes. If you have an arm cast or splint, exercise the shoulder, elbow, thumb, and fingers.  Elevate your injured arm or leg on 1 or 2  pillows for the first 1 to 3 days to decrease swelling and pain.It is best if you can comfortably elevate your cast so it is higher than your heart. SEEK MEDICAL CARE IF:   Your cast or splint cracks.  Your cast or splint  is too tight or too loose.  You have unbearable itching inside the cast.  Your cast becomes wet or develops a soft spot or area.  You have a bad smell coming from inside your cast.  You get an object stuck under your cast.  Your skin around the cast becomes red or raw.  You have new pain or worsening pain after the cast has been applied. SEEK IMMEDIATE MEDICAL CARE IF:   You have fluid leaking through the cast.  You are unable to move your fingers or toes.  You have discolored (blue or white), cool, painful, or very swollen fingers or toes beyond the cast.  You have tingling or numbness around the injured area.  You have severe pain or pressure under the cast.  You have any difficulty with your breathing or have shortness of breath.  You have chest pain. Document Released: 08/19/2000 Document Revised: 06/12/2013 Document Reviewed: 02/28/2013 [facility] Patient Information 2015 [place], [place]. This information is not intended to replace advice given to you by your health care provider. Make sure you discuss any questions you have with your health care provider.

## 2014-10-16 NOTE — ED Notes (Signed)
Pt states she tripped over a stump last night, landing on her left arm. C/o pain mostly in her elbow, is able to move arm but states her elbow feels "tight" when she does.

## 2014-10-16 NOTE — ED Notes (Signed)
Pt reports she tripped and fell last night injuring her L shoulder and elbow. Pt having difficulty extending arm. PMS intact.

## 2014-10-16 NOTE — ED Notes (Signed)
Ortho tech at bedside 

## 2015-07-17 ENCOUNTER — Emergency Department (HOSPITAL_COMMUNITY)
Admission: EM | Admit: 2015-07-17 | Discharge: 2015-07-17 | Disposition: A | Discharge status: Home / Self Care | Payer: Self-pay | Attending: Emergency Medicine | Admitting: Emergency Medicine

## 2015-07-17 ENCOUNTER — Encounter (HOSPITAL_COMMUNITY): Payer: Self-pay | Admitting: Emergency Medicine

## 2015-07-17 ENCOUNTER — Encounter (HOSPITAL_COMMUNITY): Payer: Self-pay | Admitting: *Deleted

## 2015-07-17 ENCOUNTER — Emergency Department (HOSPITAL_COMMUNITY)
Admission: EM | Admit: 2015-07-17 | Discharge: 2015-07-17 | Disposition: A | Discharge status: Nursing Home (Includes ICF) | Payer: Self-pay | Source: Home / Self Care | Attending: Family Medicine | Admitting: Family Medicine

## 2015-07-17 ENCOUNTER — Emergency Department (HOSPITAL_COMMUNITY): Payer: Self-pay

## 2015-07-17 DIAGNOSIS — Y9389 Activity, other specified: Secondary | ICD-10-CM | POA: Insufficient documentation

## 2015-07-17 DIAGNOSIS — S0083XA Contusion of other part of head, initial encounter: Secondary | ICD-10-CM | POA: Insufficient documentation

## 2015-07-17 DIAGNOSIS — Y998 Other external cause status: Secondary | ICD-10-CM | POA: Insufficient documentation

## 2015-07-17 DIAGNOSIS — S060X1A Concussion with loss of consciousness of 30 minutes or less, initial encounter: Secondary | ICD-10-CM | POA: Insufficient documentation

## 2015-07-17 DIAGNOSIS — W01198A Fall on same level from slipping, tripping and stumbling with subsequent striking against other object, initial encounter: Secondary | ICD-10-CM | POA: Insufficient documentation

## 2015-07-17 DIAGNOSIS — Z7982 Long term (current) use of aspirin: Secondary | ICD-10-CM | POA: Insufficient documentation

## 2015-07-17 DIAGNOSIS — Y9289 Other specified places as the place of occurrence of the external cause: Secondary | ICD-10-CM | POA: Insufficient documentation

## 2015-07-17 DIAGNOSIS — Z720 Tobacco use: Secondary | ICD-10-CM | POA: Insufficient documentation

## 2015-07-17 DIAGNOSIS — H6121 Impacted cerumen, right ear: Secondary | ICD-10-CM | POA: Insufficient documentation

## 2015-07-17 DIAGNOSIS — R55 Syncope and collapse: Secondary | ICD-10-CM

## 2015-07-17 DIAGNOSIS — Z79899 Other long term (current) drug therapy: Secondary | ICD-10-CM | POA: Insufficient documentation

## 2015-07-17 DIAGNOSIS — R001 Bradycardia, unspecified: Secondary | ICD-10-CM | POA: Insufficient documentation

## 2015-07-17 HISTORY — DX: Tension-type headache, unspecified, not intractable: G44.209

## 2015-07-17 LAB — I-STAT CHEM 8, ED
BUN: 9 mg/dL (ref 6–20)
CHLORIDE: 104 mmol/L (ref 101–111)
Calcium, Ion: 1.17 mmol/L (ref 1.12–1.23)
Creatinine, Ser: 0.6 mg/dL (ref 0.44–1.00)
GLUCOSE: 81 mg/dL (ref 65–99)
HCT: 41 % (ref 36.0–46.0)
Hemoglobin: 13.9 g/dL (ref 12.0–15.0)
Potassium: 4.1 mmol/L (ref 3.5–5.1)
Sodium: 140 mmol/L (ref 135–145)
TCO2: 23 mmol/L (ref 0–100)

## 2015-07-17 LAB — HCG, QUANTITATIVE, PREGNANCY: hCG, Beta Chain, Quant, S: <1 m[IU]/mL (ref <5)

## 2015-07-17 NOTE — ED Notes (Signed)
When pt went from laying flat to sitting up she stated she became lightheaded.  No change in pulse rate.

## 2015-07-17 NOTE — ED Notes (Signed)
Patient reports falling yesterday after leaving a salon.  Patient does not remember leaving the location and the next thing she remembers is paramedics talking to her.  Patient hit her head-bruising, swelling and pain.  Patient feels "slow" today.  Feels pressure in head.

## 2015-07-17 NOTE — ED Notes (Signed)
Will obtain full set of vitals when pt returns.

## 2015-07-17 NOTE — ED Provider Notes (Addendum)
CSN: [id number]     Arrival date & time 07/17/15  1305 History   First MD Initiated Contact with Patient 07/17/15 1331     Chief Complaint  Patient presents with  . Fall  . Headache   (Consider location/radiation/quality/duration/timing/severity/associated sxs/prior Treatment) Patient is a 29 y.o. female presenting with fall and headaches. The history is provided by the patient.  Fall This is a new problem. The current episode started 12 to 24 hours ago (coming out of salon and had unexplained syncope with right frontal head injury from fall. no n/v.). The problem has not changed since onset.Associated symptoms include headaches. Pertinent negatives include no chest pain and no abdominal pain. Associated symptoms comments: lmp onset today ,nl onset.Marland Kitchen.  Headache Associated symptoms: no abdominal pain, no nausea, no seizures, no vomiting and no weakness     Past Medical History  Diagnosis Date  . Tension headache    History reviewed. No pertinent past surgical history. No family history on file. Social History  Substance Use Topics  . Smoking status: Current Every Day Smoker -- 0.25 packs/day for 5 years    Types: Cigarettes  . Smokeless tobacco: Never Used  . Alcohol Use: Yes     Comment: Socially    OB History    No data available     Review of Systems  Constitutional: Positive for activity change.  Cardiovascular: Negative.  Negative for chest pain and palpitations.  Gastrointestinal: Negative for nausea, vomiting and abdominal pain.  Musculoskeletal: Negative.   Skin: Positive for wound.  Neurological: Positive for syncope and headaches. Negative for tremors, seizures, speech difficulty and weakness.  All other systems reviewed and are negative.   Allergies  Sumatriptan  Home Medications   Prior to Admission medications   Medication Sig Start Date End Date Taking? Authorizing Provider  aspirin 81 MG tablet Take 81 mg by mouth daily.    Historical Provider, MD    HYDROcodone-acetaminophen (NORCO/VICODIN) 5-325 MG per tablet Take 1-2 tablets by mouth every 6 (six) hours as needed for moderate pain or severe pain. 10/16/14   Ladona MowJoe Mintz, PA-C  ibuprofen (ADVIL,MOTRIN) 800 MG tablet Take 1 tablet (800 mg total) by mouth 3 (three) times daily. 10/16/14   Ladona MowJoe Mintz, PA-C  medroxyPROGESTERone (DEPO-PROVERA) 150 MG/ML injection Inject 150 mg into the muscle every 3 (three) months.    Historical Provider, MD   Meds Ordered and Administered this Visit  Medications - No data to display  BP 115/81 mmHg  Pulse 73  Temp(Src) 99.1 F (37.3 C) (Oral)  Resp 16  SpO2 100%  LMP 07/17/2015 No data found.   Physical Exam  Constitutional: She appears well-developed and well-nourished. No distress.  HENT:  Head: Normocephalic. Head is with contusion. Head is without laceration.    Nursing note and vitals reviewed.   ED Course  Procedures (including critical care time)  Labs Review Labs Reviewed - No data to display  Imaging Review No results found.   Visual Acuity Review  Right Eye Distance:   Left Eye Distance:   Bilateral Distance:    Right Eye Near:   Left Eye Near:    Bilateral Near:         MDM   1. Syncope and collapse    Sent for eval of unexplained syncope except 2 beers without food, yest eve, no n/v. , today not feeling well.Linna Hoff.    Ernie Sagrero D Ziyah Cordoba, MD 07/17/15 1404  Linna HoffJames D Tahara Ruffini, MD 07/17/15 [phone]

## 2015-07-17 NOTE — ED Provider Notes (Signed)
CSN: [id number]     Arrival date & time 07/17/15  1408 History  By signing my name below, I, Elon SpannerGarrett Cook, attest that this documentation has been prepared under the direction and in the presence of Fayrene HelperBowie Mihcael Ledee, PA-C. Electronically Signed: Elon SpannerGarrett Cook ED Scribe. 07/17/2015. 3:39 PM.    Chief Complaint  Patient presents with  . Fall   The history is provided by the patient. No language interpreter was used.   HPI Comments: Angela Hale is a 29 y.o. otherwise healthy female who presents to the Emergency Department complaining of right-sided facial pressure after a syncopal fall yesterday; treated with ibuprofen.  The patient reports she had just finished getting her nails done at a salon when she walked outside and fell, hitting her head.  She does not recall the fall itself and does not know how long she lost consciousness.  The patient was evaluated by EMS on the scene and refused transport to the ED.  Since the fall, she has had some mild right-sided neck pain, frequent night-waking, and awoke today with the feeling that ""my motor skills are slowed down."  She is able to ambulate normally.  The patient was seen at Urgent Care PTA and referred to ED.  She reports eating only a small amount yesterday and having 1 beer several hours prior to the fall.  She denies any drug use and takes no medications regularly.  She denies prior episodes, heart conditions. She denies pre-syncopal symptoms, vision changes, numbness/weakness, dizziness.  LNMP began today.    Past Medical History  Diagnosis Date  . Tension headache    History reviewed. No pertinent past surgical history. No family history on file. Social History  Substance Use Topics  . Smoking status: Current Every Day Smoker -- 0.25 packs/day for 5 years    Types: Cigarettes  . Smokeless tobacco: Never Used  . Alcohol Use: Yes     Comment: Socially    OB History    No data available     Review of Systems  Eyes: Negative for visual  disturbance.  Skin: Positive for wound.  Neurological: Positive for syncope and headaches. Negative for dizziness, weakness and numbness.      Allergies  Sumatriptan  Home Medications   Prior to Admission medications   Medication Sig Start Date End Date Taking? Authorizing Provider  aspirin 81 MG tablet Take 81 mg by mouth daily.    Historical Provider, MD  HYDROcodone-acetaminophen (NORCO/VICODIN) 5-325 MG per tablet Take 1-2 tablets by mouth every 6 (six) hours as needed for moderate pain or severe pain. 10/16/14   Ladona MowJoe Mintz, PA-C  ibuprofen (ADVIL,MOTRIN) 800 MG tablet Take 1 tablet (800 mg total) by mouth 3 (three) times daily. 10/16/14   Ladona MowJoe Mintz, PA-C  medroxyPROGESTERone (DEPO-PROVERA) 150 MG/ML injection Inject 150 mg into the muscle every 3 (three) months.    Historical Provider, MD   LMP 07/17/2015 Physical Exam  Constitutional: She is oriented to person, place, and time. She appears well-developed and well-nourished. No distress.  HENT:  Head: Normocephalic.  Right ear drum with cerumen impaction so unable to visualize TM.  No septal hematoma.  No malocclusion.  Moderate sized ecchymosis noted to right forehead that is TTP.  No crepitus noted.  No battle sign.  PERRL.  EOM intact.   Eyes: Conjunctivae and EOM are normal.  Neck: Neck supple. No tracheal deviation present.  Cardiovascular: Normal rate and normal heart sounds.   Pulmonary/Chest: Effort normal and breath sounds normal.  No respiratory distress. She has no wheezes. She has no rales.  Musculoskeletal: Normal range of motion.  No significant mid-line spine tenderness.   Neurological: She is alert and oriented to person, place, and time.  Able to ambulate.  CN II-XII are grossly intact.   Skin: Skin is warm and dry.  Psychiatric: She has a normal mood and affect. Her behavior is normal.  Nursing note and vitals reviewed.   ED Course  Procedures (including critical care time)  DIAGNOSTIC STUDIES: Oxygen  Saturation is 100% on RA, normal by my interpretation.    COORDINATION OF CARE:  3:23 PM Discussed advanced imaging options with patient who stated she would prefer to have imaging performed.  Will order head CT.  Patient acknowledges and agrees with plan.    4:45 PM Pt became symptomatic with positional changes.  She is also bradycardic without signs of heart block, which may contribute to her syncopal episode.  Head CT without acute intracranial abnormality aside from R frontal scalp hematoma.  Normal electrolytes panel and normal Hgb.    5:02 PM Pt without family hx of premature cardiac death.  She is back to her normal baseline.  She is aware of her bradycardia and agrees to f/u with cardiologist for further evaluation which may include Holter Monitoring.  Strict return precaution discussed.  Concussion protocol discussed.  Care discussed with Dr. Cyndie Chime.  Imaging Review Ct Head Wo Contrast  07/17/2015  CLINICAL DATA:  Pt states she fell on concrete yesterday. Pt did not receive treatment yesterday. Pt unsure of how she fell yesterday. Pt states today she feels off and feels slow. Pt has a knot and bruising right side of head and headache right side. EXAM: CT HEAD WITHOUT CONTRAST TECHNIQUE: Contiguous axial images were obtained from the base of the skull through the vertex without intravenous contrast. COMPARISON:  None. FINDINGS: There is no intra or extra-axial fluid collection or mass lesion. The basilar cisterns and ventricles have a normal appearance. There is no CT evidence for acute infarction or hemorrhage. No calvarial fracture. Right frontal scalp hematoma is noted. Paranasal sinuses and mastoid air cells normally aerated. IMPRESSION: 1.  No evidence for acute intracranial abnormality. 2. Right frontal scalp hematoma. Electronically Signed   By: Norva Pavlov M.D.   On: 07/17/2015 16:09   I have personally reviewed and evaluated these images and lab results as part of my medical  decision-making.   Date: 07/17/2015  Rate: 49  Rhythm: sinus bradycardia  QRS Axis: normal  Intervals: normal  ST/T Wave abnormalities: normal  Conduction Disutrbances: none  Narrative Interpretation:   Old EKG Reviewed: heart rate decrease since last tracing     MDM   Final diagnoses:  Syncope and collapse  Bradycardia  Traumatic hematoma of forehead, initial encounter    BP 128/86 mmHg  Pulse 51  Temp(Src) 98.4 F (36.9 C) (Oral)  Resp 18  SpO2 100%  LMP 07/17/2015  I personally performed the services described in this documentation, which was scribed in my presence. The recorded information has been reviewed and is accurate.      Fayrene Helper, PA-C 07/17/15 1727  Leta Baptist, MD 07/19/15 [phone]

## 2015-07-17 NOTE — ED Notes (Signed)
The pt fell yesterday striking the  Rt side of her face.  She is c/o pain  lmp  today

## 2015-07-17 NOTE — Discharge Instructions (Signed)
Please follow up with cardiologist next week for further evaluation of your passing out episode and your low heart rate.  Take ibuprofen or tylenol for pain.  Return to ER if you have any concerns.  Syncope Syncope is a medical term for fainting or passing out. This means you lose consciousness and drop to the ground. People are generally unconscious for less than 5 minutes. You may have some muscle twitches for up to 15 seconds before waking up and returning to normal. Syncope occurs more often in older adults, but it can happen to anyone. While most causes of syncope are not dangerous, syncope can be a sign of a serious medical problem. It is important to seek medical care.  CAUSES  Syncope is caused by a sudden drop in blood flow to the brain. The specific cause is often not determined. Factors that can bring on syncope include:  Taking medicines that lower blood pressure.  Sudden changes in posture, such as standing up quickly.  Taking more medicine than prescribed.  Standing in one place for too long.  Seizure disorders.  Dehydration and excessive exposure to heat.  Low blood sugar (hypoglycemia).  Straining to have a bowel movement.  Heart disease, irregular heartbeat, or other circulatory problems.  Fear, emotional distress, seeing blood, or severe pain. SYMPTOMS  Right before fainting, you may:  Feel dizzy or light-headed.  Feel nauseous.  See all white or all black in your field of vision.  Have cold, clammy skin. DIAGNOSIS  Your health care provider will ask about your symptoms, perform a physical exam, and perform an electrocardiogram (ECG) to record the electrical activity of your heart. Your health care provider may also perform other heart or blood tests to determine the cause of your syncope which may include:  Transthoracic echocardiogram (TTE). During echocardiography, sound waves are used to evaluate how blood flows through your heart.  Transesophageal  echocardiogram (TEE).  Cardiac monitoring. This allows your health care provider to monitor your heart rate and rhythm in real time.  Holter monitor. This is a portable device that records your heartbeat and can help diagnose heart arrhythmias. It allows your health care provider to track your heart activity for several days, if needed.  Stress tests by exercise or by giving medicine that makes the heart beat faster. TREATMENT  In most cases, no treatment is needed. Depending on the cause of your syncope, your health care provider may recommend changing or stopping some of your medicines. HOME CARE INSTRUCTIONS  Have someone stay with you until you feel stable.  Do not drive, use machinery, or play sports until your health care provider says it is okay.  Keep all follow-up appointments as directed by your health care provider.  Lie down right away if you start feeling like you might faint. Breathe deeply and steadily. Wait until all the symptoms have passed.  Drink enough fluids to keep your urine clear or pale yellow.  If you are taking blood pressure or heart medicine, get up slowly and take several minutes to sit and then stand. This can reduce dizziness. SEEK IMMEDIATE MEDICAL CARE IF:   You have a severe headache.  You have unusual pain in the chest, abdomen, or back.  You are bleeding from your mouth or rectum, or you have black or tarry stool.  You have an irregular or very fast heartbeat.  You have pain with breathing.  You have repeated fainting or seizure-like jerking during an episode.  You faint when  sitting or lying down.  You have confusion.  You have trouble walking.  You have severe weakness.  You have vision problems. If you fainted, call your local emergency services (911 in [place] Do not drive yourself to the hospital.    This information is not intended to replace advice given to you by your health care provider. Make sure you discuss any questions  you have with your health care provider.   Document Released: 08/22/2005 Document Revised: 01/06/2015 Document Reviewed: 10/21/2011 Elsevier Interactive Patient Education 2016 [employer]  Contusion A contusion is a deep bruise. Contusions happen when an injury causes bleeding under the skin. Symptoms of bruising include pain, swelling, and discolored skin. The skin may turn blue, purple, or yellow. HOME CARE   Rest the injured area.  If told, put ice on the injured area.  Put ice in a plastic bag.  Place a towel between your skin and the bag.  Leave the ice on for 20 minutes, 2-3 times per day.  If told, put light pressure (compression) on the injured area using an elastic bandage. Make sure the bandage is not too tight. Remove it and put it back on as told by your doctor.  If possible, raise (elevate) the injured area above the level of your heart while you are sitting or lying down.  Take over-the-counter and prescription medicines only as told by your doctor. GET HELP IF:  Your symptoms do not get better after several days of treatment.  Your symptoms get worse.  You have trouble moving the injured area. GET HELP RIGHT AWAY IF:   You have very bad pain.  You have a loss of feeling (numbness) in a hand or foot.  Your hand or foot turns pale or cold.   This information is not intended to replace advice given to you by your health care provider. Make sure you discuss any questions you have with your health care provider.   Document Released: 02/08/2008 Document Revised: 05/13/2015 Document Reviewed: 01/07/2015 Elsevier Interactive Patient Education [employer]

## 2015-07-17 NOTE — ED Notes (Signed)
Patient states passed out at nail clinic yesterday, refused ambulance ride to ED.   Patient states went to urgent care today and was referred to ED.   Large hematoma to R forehead.   Patient complains of feeling "off".  Denies dizziness or lightheadedness at this time.

## 2019-07-04 ENCOUNTER — Encounter (HOSPITAL_COMMUNITY): Payer: Self-pay

## 2019-07-04 ENCOUNTER — Emergency Department (HOSPITAL_COMMUNITY)
Admission: EM | Admit: 2019-07-04 | Discharge: 2019-07-05 | Disposition: A | Discharge status: Home / Self Care | Payer: PRIVATE HEALTH INSURANCE | Attending: Emergency Medicine | Admitting: Emergency Medicine

## 2019-07-04 ENCOUNTER — Emergency Department (HOSPITAL_COMMUNITY): Payer: PRIVATE HEALTH INSURANCE

## 2019-07-04 ENCOUNTER — Other Ambulatory Visit: Payer: Self-pay

## 2019-07-04 DIAGNOSIS — F17210 Nicotine dependence, cigarettes, uncomplicated: Secondary | ICD-10-CM | POA: Diagnosis not present

## 2019-07-04 DIAGNOSIS — N3001 Acute cystitis with hematuria: Secondary | ICD-10-CM | POA: Diagnosis not present

## 2019-07-04 DIAGNOSIS — R531 Weakness: Secondary | ICD-10-CM | POA: Diagnosis present

## 2019-07-04 LAB — I-STAT BETA HCG BLOOD, ED (MC, WL, AP ONLY): I-stat hCG, quantitative: <5 m[IU]/mL (ref <5)

## 2019-07-04 LAB — URINALYSIS, ROUTINE W REFLEX MICROSCOPIC
Bilirubin Urine: NEGATIVE
Glucose, UA: NEGATIVE mg/dL
Ketones, ur: NEGATIVE mg/dL
Nitrite: NEGATIVE
Protein, ur: NEGATIVE mg/dL
Specific Gravity, Urine: 1.009 (ref 1.005–1.030)
Squamous Epithelial / LPF: >50 — ABNORMAL HIGH (ref 0–5)
pH: 5 (ref 5.0–8.0)

## 2019-07-04 LAB — BASIC METABOLIC PANEL
Anion gap: 13 (ref 5–15)
BUN: 9 mg/dL (ref 6–20)
CO2: 22 mmol/L (ref 22–32)
Calcium: 9.7 mg/dL (ref 8.9–10.3)
Chloride: 102 mmol/L (ref 98–111)
Creatinine, Ser: 0.6 mg/dL (ref 0.44–1.00)
GFR calc Af Amer: >60 mL/min (ref >60)
GFR calc non Af Amer: >60 mL/min (ref >60)
Glucose, Bld: 88 mg/dL (ref 70–99)
Potassium: 4.1 mmol/L (ref 3.5–5.1)
Sodium: 137 mmol/L (ref 135–145)

## 2019-07-04 LAB — CBC
HCT: 43.1 % (ref 36.0–46.0)
Hemoglobin: 14.7 g/dL (ref 12.0–15.0)
MCH: 30.8 pg (ref 26.0–34.0)
MCHC: 34.1 g/dL (ref 30.0–36.0)
MCV: 90.2 fL (ref 80.0–100.0)
Platelets: 392 10*3/uL (ref 150–400)
RBC: 4.78 MIL/uL (ref 3.87–5.11)
RDW: 12.1 % (ref 11.5–15.5)
WBC: 9.4 10*3/uL (ref 4.0–10.5)
nRBC: 0 % (ref 0.0–0.2)

## 2019-07-04 LAB — TROPONIN I (HIGH SENSITIVITY)
Troponin I (High Sensitivity): 3 ng/L (ref <18)
Troponin I (High Sensitivity): 2 ng/L (ref <18)

## 2019-07-04 MED ORDER — SODIUM CHLORIDE 0.9% FLUSH: 3.0000 mL | Freq: Once | INTRAVENOUS | Status: DC

## 2019-07-04 NOTE — ED Triage Notes (Signed)
Pt reports generalized weakness and chest pain since this morning. Pt states she feels weird, unable to really describe feeling. Pt a.o, nad noted.

## 2019-07-05 ENCOUNTER — Encounter (HOSPITAL_COMMUNITY): Payer: Self-pay | Admitting: Radiology

## 2019-07-05 ENCOUNTER — Emergency Department (HOSPITAL_COMMUNITY): Payer: PRIVATE HEALTH INSURANCE

## 2019-07-05 LAB — CBG MONITORING, ED: Glucose-Capillary: 80 mg/dL (ref 70–99)

## 2019-07-05 MED ORDER — CEPHALEXIN 500 MG PO CAPS: 500.0000 mg | ORAL_CAPSULE | Freq: Four times a day (QID) | ORAL | 0 refills | Status: AC

## 2019-07-05 NOTE — Discharge Instructions (Addendum)
Thank you for allowing me to care for you today in the Emergency Department.   Take 1 tablet of Keflex every 6 hours for the next 5 days.  Take 650 mg of Tylenol or 600 mg of ibuprofen with food every 6 hours for pain.  You can alternate between these 2 medications every 3 hours if your pain returns.  For instance, you can take Tylenol at noon, followed by a dose of ibuprofen at 3, followed by second dose of Tylenol and 6.  Return to the emergency department if you develop respiratory distress, high fevers that do not improve with Tylenol or ibuprofen, persistent vomiting, constant chest pain that is worse with exertion, or other new, concerning symptoms.

## 2019-07-05 NOTE — ED Provider Notes (Signed)
[facility]   CSN: [id number] Arrival date & time: 07/04/19  1907     History   Chief Complaint Chief Complaint  Patient presents with   Weakness   Chest Pain   Dizziness    HPI Margaretmary EddySonia D Hale is a 33 y.o. female with a history of shingles, depression, and anxiety who presents to the emergency department with a chief complaint of generalized weakness.  States that she has not been feeling like herself for most of the day.  Feels as if she is more tired and generally weak over the last few days.  She also notes that she is having some aching in her chest and upper back.  She is unable to characterize the pain.  No known aggravating relieving factors.  She was initially concerned that her sugar might be low as she has previously been seen in the ER several years ago for syncope, but denies any recent syncopal episodes.  She reports that earlier today she developed some left-sided flank pain.  She denies fever, chills, shortness of breath, cough, URI symptoms, hematuria, nausea, vomiting, abdominal pain.  No known sick contacts.  She reports that she has been working longer hours at her job due to the upcoming holiday.  She reports that she does work with the public, but denies any known COVID-19 contacts.     The history is provided by the patient. No language interpreter was used.    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 No pertinent surgical history.   OB History   No obstetric history on file.      Home Medications    Prior to Admission medications   Medication Sig  Start Date End Date Taking? Authorizing Provider  cephALEXin (KEFLEX) 500 MG capsule Take 1 capsule (500 mg total) by mouth 4 (four) times daily for 5 days. 07/05/19 07/10/19  Roneshia Drew, Pedro EarlsMia A, PA-C    Family History No family history on file.  Social History Social History   Tobacco Use   Smoking status: Current Every Day Smoker    Packs/day: 0.25    Years: 5.00    Pack years: 1.25    Types: Cigarettes   Smokeless tobacco: Never Used  Substance Use Topics   Alcohol use: Yes    Comment: Socially    Drug use: No     Allergies   Sumatriptan   Review of Systems Review of Systems  Constitutional: Positive for fatigue. Negative for activity change, chills and fever.  Respiratory: Negative for shortness of breath.   Cardiovascular: Positive for chest pain.  Gastrointestinal: Negative for abdominal pain, blood in stool, diarrhea, nausea and vomiting.  Genitourinary: Negative for dysuria.  Musculoskeletal: Positive for back pain and myalgias.  Skin: Negative for rash.  Allergic/Immunologic: Negative for immunocompromised state.  Neurological: Positive for weakness (generalized weakness). Negative for seizures, syncope, numbness and headaches.  Psychiatric/Behavioral: Negative for confusion.   Physical Exam Updated Vital Signs BP (!) 130/95    Pulse 64    Temp 98.2 F (36.8 C) (Oral)    Resp 17    Ht 5\' 7"  (  1.702 m)    Wt 81.6 kg    LMP 06/04/2019    SpO2 100%    BMI 28.19 kg/m   Physical Exam Vitals signs and nursing note reviewed.  Constitutional:      General: She is not in acute distress.    Appearance: She is not ill-appearing, toxic-appearing or diaphoretic.  HENT:     Head: Normocephalic.  Eyes:     Conjunctiva/sclera: Conjunctivae normal.  Neck:     Musculoskeletal: Neck supple.  Cardiovascular:     Rate and Rhythm: Normal rate and regular rhythm.     Heart sounds: No murmur. No friction rub. No gallop.   Pulmonary:     Effort: Pulmonary effort is  normal. No respiratory distress.     Breath sounds: Normal breath sounds. No stridor. No wheezing, rhonchi or rales.     Comments: Lungs are clear to auscultation bilaterally.  No increased work of breathing. Chest:     Chest wall: No tenderness.  Abdominal:     General: There is no distension.     Palpations: Abdomen is soft. There is no mass.     Tenderness: There is no abdominal tenderness. There is no right CVA tenderness, left CVA tenderness, guarding or rebound.     Hernia: No hernia is present.     Comments: No CVA tenderness bilaterally.  Musculoskeletal:     Comments: No tenderness to the cervical, thoracic, lumbar spine or spinous processes.  Skin:    General: Skin is warm.     Findings: No rash.  Neurological:     Mental Status: She is alert.  Psychiatric:        Behavior: Behavior normal.      ED Treatments / Results  Labs (all labs ordered are listed, but only abnormal results are displayed) Labs Reviewed  URINALYSIS, ROUTINE W REFLEX MICROSCOPIC - Abnormal; Notable for the following components:      Result Value   APPearance TURBID (*)    Hgb urine dipstick SMALL (*)    Leukocytes,Ua LARGE (*)    Bacteria, UA FEW (*)    Squamous Epithelial / LPF >50 (*)    All other components within normal limits  URINE CULTURE  BASIC METABOLIC PANEL  CBC  I-STAT BETA HCG BLOOD, ED ([facility], [facility], [facility] ONLY)  CBG MONITORING, ED  TROPONIN I (HIGH SENSITIVITY)  TROPONIN I (HIGH SENSITIVITY)    EKG EKG Interpretation  Date/Time:  Thursday July 04 2019 19:12:38 EDT Ventricular Rate:  70 PR Interval:  134 QRS Duration: 82 QT Interval:  418 QTC Calculation: 451 R Axis:   38 Text Interpretation: Normal sinus rhythm Possible Anterior infarct , age undetermined Abnormal ECG When compared with ECG of 07/17/2015, No significant change was found Confirmed by Delora Fuel ([id number]) on 07/04/2019 11:27:27 PM   Radiology Dg Chest 2 View  Result Date: 07/04/2019 CLINICAL DATA:   Chest pain beginning this morning.  Weakness. EXAM: CHEST - 2 VIEW COMPARISON:  None. FINDINGS: The heart size and mediastinal contours are within normal limits. Both lungs are clear. The visualized skeletal structures are unremarkable. IMPRESSION: Negative.  No active cardiopulmonary disease. Electronically Signed   By: Marlaine Hind M.D.   On: 07/04/2019 19:51   Ct Renal Stone Study  Result Date: 07/05/2019 CLINICAL DATA:  Generalized abdominal pain since yesterday morning. EXAM: CT ABDOMEN AND PELVIS WITHOUT CONTRAST TECHNIQUE: Multidetector CT imaging of the abdomen and pelvis was performed following the standard protocol without IV contrast. COMPARISON:  None. FINDINGS: Lower chest: The lung bases are clear of acute process. No pleural effusion or pulmonary lesions. The heart is normal in size. No pericardial effusion. The distal esophagus and aorta are unremarkable. Hepatobiliary: No focal hepatic lesions or intrahepatic biliary dilatation. The gallbladder is normal. No common bile duct dilatation. Pancreas: No mass, inflammation or ductal dilatation. Spleen: Normal size.  No focal lesions. Adrenals/Urinary Tract: The adrenal glands are normal. No renal, ureteral or bladder calculi or mass is identified without contrast. Stomach/Bowel: The stomach, duodenum, small bowel and colon are unremarkable without contrast. No acute inflammatory changes, mass lesions or obstructive findings. The terminal ileum is normal. The appendix is normal. Moderate fibrofatty infiltrated type changes involving the ascending colon typically seen with remote inflammatory bowel disease. No evidence of acute inflammation. Vascular/Lymphatic: The aorta is normal in caliber. No atheroscerlotic calcifications. No mesenteric of retroperitoneal mass or adenopathy. Small scattered lymph nodes are noted. Reproductive: The uterus and ovaries are normal. Other: No pelvic mass or free pelvic fluid collections. No inguinal mass or hernia.  Small periumbilical abdominal wall hernia containing fat. Musculoskeletal: No significant bony findings. IMPRESSION: 1. No acute abdominal/pelvic findings, mass lesions or adenopathy. 2. No renal, ureteral or bladder calculi or mass. 3. Fibrofatty infiltrated type changes involving the a single: Typically seen with remote inflammatory bowel disease. No acute inflammation. Electronically Signed   By: Rudie Meyer M.D.   On: 07/05/2019 06:08    Procedures Procedures (including critical care time)  Medications Ordered in ED Medications  sodium chloride flush (NS) 0.9 % injection 3 mL (has no administration in time range)     Initial Impression / Assessment and Plan / ED Course  I have reviewed the triage vital signs and the nursing notes.  Pertinent labs & imaging results that were available during my care of the patient were reviewed by me and considered in my medical decision making (see chart for details).        33 year old female with a history of shingles, depression, and anxiety presenting with generalized weakness, fatigue, and diffuse back and chest pain, onset today.  States she is having a difficult time describing her symptoms, but she "feels off".   EKG is unchanged from previous.  Chest x-ray is unremarkable.  Troponin is not elevated.  Labs are reassuring.  Chest pain is not likely of cardiac or pulmonary etiology d/t presentation, PERC negative, VSS, no tracheal deviation, no JVD or new murmur, RRR, breath sounds equal bilaterally, EKG without acute abnormalities, negative troponin, and negative CXR. Pt has been advised to return to the ED if CP becomes exertional, associated with diaphoresis or nausea, radiates to left jaw/arm, worsens or becomes concerning in any way.   Urinalysis with turbid urine and small hemoglobinuria, leukocyte esterase, and amorphous crystals.  Will check CT stone study.  CT stone study is unremarkable for kidney stone, but there is some fibrofatty  infiltrated type changes noted that are typically seen with remote inflammatory bowel disease.  The patient denies a history of this.  We will send urine for culture.  Since she is having some flank pain and urine is concerning for infection, we will discharge her home with Keflex.  Patient is agreeable with this plan.  ER return precautions given.  She is hemodynamically stable and in no acute distress.  Safe for discharge home with outpatient follow-up as needed.  Final Clinical Impressions(s) / ED Diagnoses   Final diagnoses:  Acute cystitis with hematuria    ED  Discharge Orders         Ordered    cephALEXin (KEFLEX) 500 MG capsule  4 times daily     07/05/19 0633           Barkley Boards, PA-C 07/05/19 1610    Dione Booze, MD 07/05/19 2244

## 2019-07-05 NOTE — ED Notes (Signed)
Patient verbalizes understanding of discharge instructions. Opportunity for questioning and answers were provided. Armband removed by staff, pt discharged from ED. Pt. ambulatory and discharged home.  

## 2020-07-10 IMAGING — DX DG CHEST 2V
2 series · 2 of 2 positions shown · non-contrast
Comparison: None.

CLINICAL DATA: Chest pain beginning this morning.  Weakness.

EXAM:
CHEST - 2 VIEW

[chest pa]
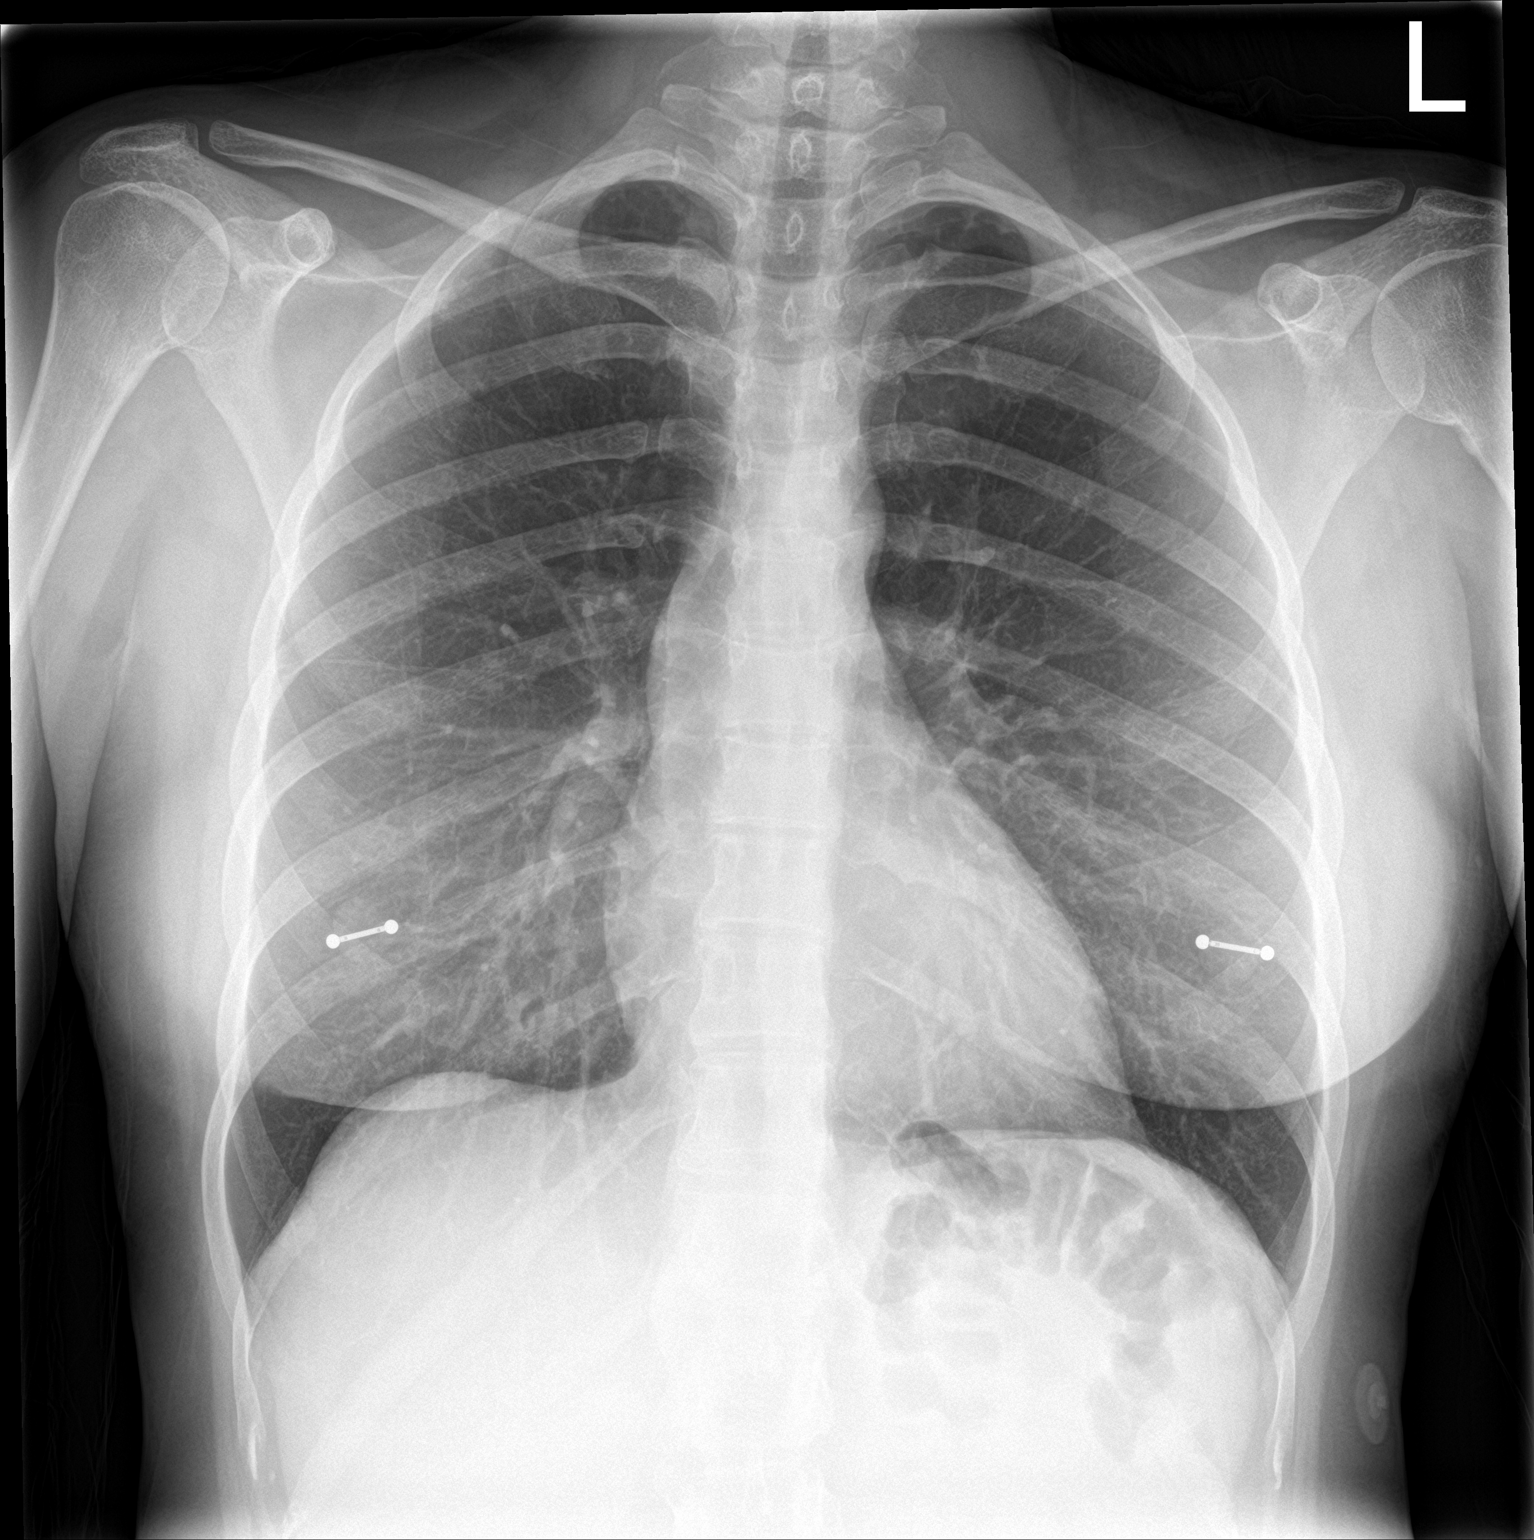

[chest lat]
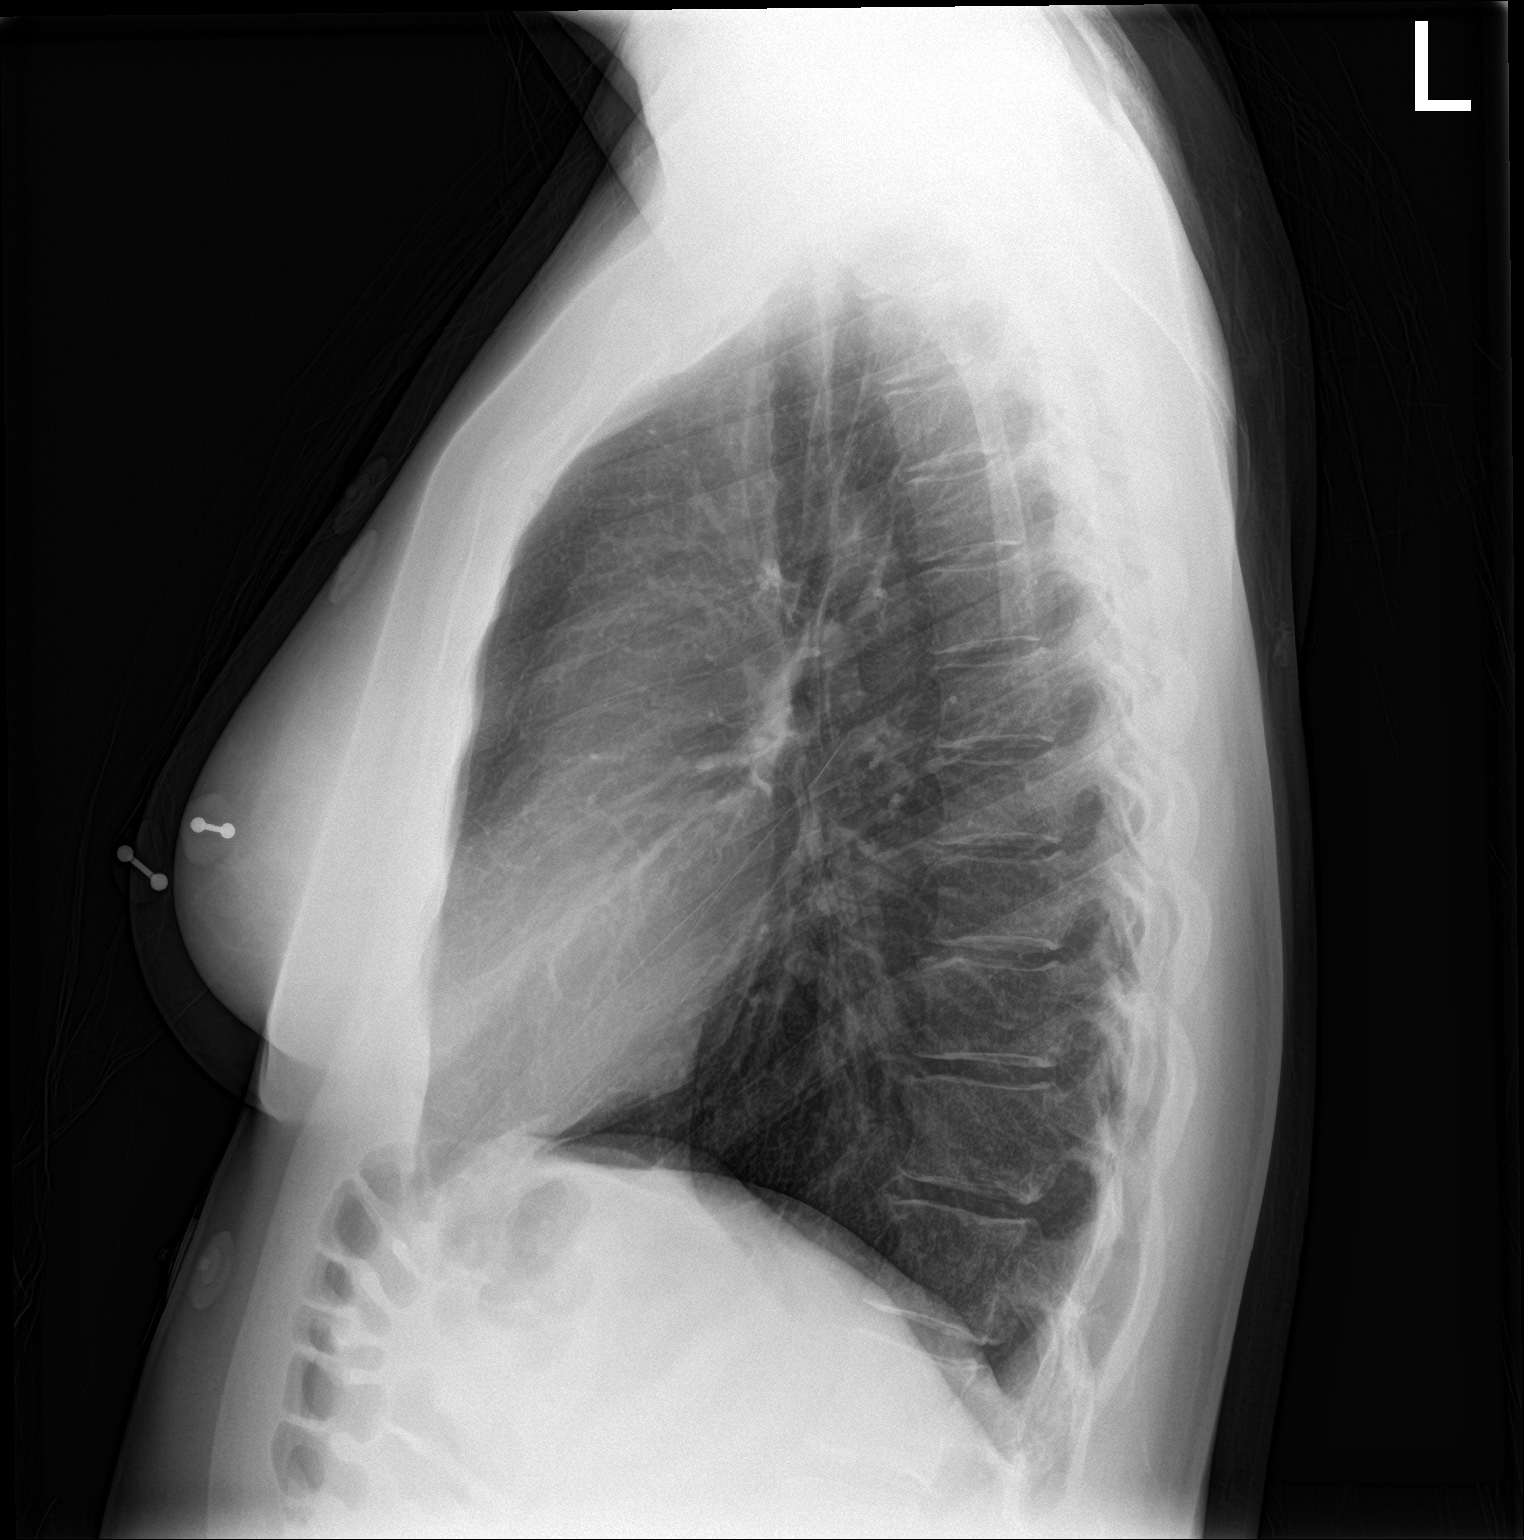

[2 of 2 positions shown; findings below may reference images not displayed]

FINDINGS: The heart size and mediastinal contours are within normal limits.
Both lungs are clear. The visualized skeletal structures are
unremarkable.
IMPRESSION: Negative.  No active cardiopulmonary disease.

## 2021-08-17 DIAGNOSIS — Z113 Encounter for screening for infections with a predominantly sexual mode of transmission: Secondary | ICD-10-CM | POA: Diagnosis not present

## 2021-08-17 DIAGNOSIS — Z114 Encounter for screening for human immunodeficiency virus [HIV]: Secondary | ICD-10-CM | POA: Diagnosis not present

## 2021-08-25 ENCOUNTER — Other Ambulatory Visit: Payer: Self-pay | Admitting: Certified Nurse Midwife

## 2021-08-25 DIAGNOSIS — N946 Dysmenorrhea, unspecified: Secondary | ICD-10-CM

## 2021-09-14 ENCOUNTER — Other Ambulatory Visit: Payer: No Typology Code available for payment source

## 2021-09-22 ENCOUNTER — Ambulatory Visit
Admission: RE | Admit: 2021-09-22 | Discharge: 2021-09-22 | Disposition: A | Discharge status: Home / Self Care | Payer: Medicaid Other | Source: Ambulatory Visit | Attending: Certified Nurse Midwife | Admitting: Certified Nurse Midwife

## 2021-09-22 DIAGNOSIS — N946 Dysmenorrhea, unspecified: Secondary | ICD-10-CM

## 2022-04-25 ENCOUNTER — Other Ambulatory Visit: Payer: Self-pay

## 2022-04-25 ENCOUNTER — Encounter (HOSPITAL_COMMUNITY): Payer: Self-pay

## 2022-04-25 ENCOUNTER — Emergency Department (HOSPITAL_COMMUNITY)
Admission: EM | Admit: 2022-04-25 | Discharge: 2022-04-25 | Disposition: A | Discharge status: Home / Self Care | Payer: Medicaid Other | Attending: Emergency Medicine | Admitting: Emergency Medicine

## 2022-04-25 ENCOUNTER — Emergency Department (HOSPITAL_COMMUNITY): Payer: Medicaid Other

## 2022-04-25 DIAGNOSIS — R531 Weakness: Secondary | ICD-10-CM | POA: Insufficient documentation

## 2022-04-25 DIAGNOSIS — R0602 Shortness of breath: Secondary | ICD-10-CM | POA: Insufficient documentation

## 2022-04-25 DIAGNOSIS — E876 Hypokalemia: Secondary | ICD-10-CM | POA: Insufficient documentation

## 2022-04-25 DIAGNOSIS — R0789 Other chest pain: Secondary | ICD-10-CM | POA: Insufficient documentation

## 2022-04-25 DIAGNOSIS — R200 Anesthesia of skin: Secondary | ICD-10-CM | POA: Insufficient documentation

## 2022-04-25 LAB — BASIC METABOLIC PANEL
Anion gap: 8 (ref 5–15)
BUN: 7 mg/dL (ref 6–20)
CO2: 25 mmol/L (ref 22–32)
Calcium: 9.1 mg/dL (ref 8.9–10.3)
Chloride: 105 mmol/L (ref 98–111)
Creatinine, Ser: 0.78 mg/dL (ref 0.44–1.00)
GFR, Estimated: >60 mL/min (ref >60)
Glucose, Bld: 99 mg/dL (ref 70–99)
Potassium: 3.2 mmol/L — ABNORMAL LOW (ref 3.5–5.1)
Sodium: 138 mmol/L (ref 135–145)

## 2022-04-25 LAB — CBC
HCT: 44.2 % (ref 36.0–46.0)
Hemoglobin: 15.4 g/dL — ABNORMAL HIGH (ref 12.0–15.0)
MCH: 32.4 pg (ref 26.0–34.0)
MCHC: 34.8 g/dL (ref 30.0–36.0)
MCV: 92.9 fL (ref 80.0–100.0)
Platelets: 297 10*3/uL (ref 150–400)
RBC: 4.76 MIL/uL (ref 3.87–5.11)
RDW: 12.8 % (ref 11.5–15.5)
WBC: 8.7 10*3/uL (ref 4.0–10.5)
nRBC: 0 % (ref 0.0–0.2)

## 2022-04-25 LAB — TROPONIN I (HIGH SENSITIVITY)
Troponin I (High Sensitivity): 2 ng/L (ref <18)
Troponin I (High Sensitivity): 2 ng/L (ref <18)

## 2022-04-25 LAB — MAGNESIUM: Magnesium: 1.7 mg/dL (ref 1.7–2.4)

## 2022-04-25 LAB — HCG, QUANTITATIVE, PREGNANCY: hCG, Beta Chain, Quant, S: <1 m[IU]/mL (ref <5)

## 2022-04-25 LAB — TSH: TSH: 1.663 u[IU]/mL (ref 0.350–4.500)

## 2022-04-25 MED ORDER — POTASSIUM CHLORIDE CRYS ER 20 MEQ PO TBCR
40.0000 meq | EXTENDED_RELEASE_TABLET | Freq: Once | ORAL | Status: AC
  Administered 2022-04-25: 40 meq via ORAL
  Filled 2022-04-25: qty 2

## 2022-04-25 NOTE — ED Notes (Signed)
Pt transport to XR 

## 2022-04-25 NOTE — Discharge Instructions (Addendum)
You were seen in the ER for evaluation of your chest tightness and generalized weakness. You labs showed that you had a low potassium which we were able to replinish while you were in the ER. I would like for you to follow up with your PCP for re-valuation of this. I have included the information for a cardiologist for you to follow up with for your chest tightness. If you have any chest pain, SOB, lightheadedness, fainting, please return to the ER for re-evaluation.   Contact a doctor if: Your chest pain does not go away. You feel depressed. You have a fever. Get help right away if: Your chest pain is worse. You have a cough that gets worse, or you cough up blood. You have very bad (severe) pain in your belly (abdomen). You pass out (faint). You have either of these for no clear reason: Sudden chest discomfort. Sudden discomfort in your arms, back, neck, or jaw. You have shortness of breath at any time. You suddenly start to sweat, or your skin gets clammy. You feel sick to your stomach (nauseous). You throw up (vomit). You suddenly feel lightheaded or dizzy. You feel very weak or tired. Your heart starts to beat fast, or it feels like it is skipping beats. These symptoms may be an emergency. Do not wait to see if the symptoms will go away. Get medical help right away. Call your local emergency services (911 in the [place] Do not drive yourself to the hospital.

## 2022-04-25 NOTE — ED Notes (Signed)
Pt ambulatory without assistance.  

## 2022-04-25 NOTE — ED Notes (Signed)
Unsuccessful straight stick in L AC

## 2022-04-25 NOTE — ED Provider Notes (Signed)
[facility] DEPT Provider Note   CSN: [id number] Arrival date & time: 04/25/22  1832     History  No chief complaint on file.   Angela Hale is a 36 y.o. female otherwise healthy presents to the ED for evaluation of her bilateral hand numbness and "locking", chest heaviness, and generalized weakness while at work today. 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  She reports that she woke up this morning with some anxiety, but has never had an anxiety attack or panic attack.  She reports that she recently moved and has been dealing with the stress of that.  She denies any shortness of breath, cough, or fever during this time.  Denies any headache or unilateral weakness.  HPI     Home Medications Prior to Admission medications   Not on File      Allergies    Sumatriptan    Review of Systems   Review of Systems  Constitutional:  Negative for chills and fever.  HENT:  Negative for congestion and rhinorrhea.   Respiratory:  Negative for shortness of breath.   Cardiovascular:  Positive for chest pain.  Gastrointestinal:  Negative for abdominal pain, nausea and vomiting.  Genitourinary:  Negative for dysuria and hematuria.  Neurological:  Positive for weakness and numbness. Negative for headaches.    Physical Exam Updated Vital Signs BP (!) 154/104 (BP Location: Right Arm)   Pulse 77   Temp 98.3 F (36.8 C) (Oral)   Resp 18   Ht 5\' 7"  (1.702 m)   SpO2 100%   BMI 28.19 kg/m  Physical Exam Vitals and nursing note reviewed.  Constitutional:      General: She is not in acute distress.    Appearance: Normal appearance. She is not ill-appearing or toxic-appearing.  HENT:     Head: Normocephalic and atraumatic.  Eyes:     General: No scleral icterus. Cardiovascular:     Rate and Rhythm: Normal rate and regular rhythm.  Pulmonary:     Effort: Pulmonary effort is normal. No respiratory distress.      Breath sounds: Normal breath sounds.  Abdominal:     General: Bowel sounds are normal.     Palpations: Abdomen is soft.     Tenderness: There is no abdominal tenderness. There is no guarding or rebound.  Musculoskeletal:        General: No deformity.     Cervical back: Normal range of motion.  Skin:    General: Skin is warm and dry.  Neurological:     General: No focal deficit present.     Mental Status: She is alert. Mental status is at baseline.     Comments: Answering questions appropriately with appropriate speech.  Cranial nerves II through XII intact.  Equal strength in patient's upper and lower bilateral extremities.  Equal grip strength.  Sensation intact throughout. No pronator drift.      ED Results / Procedures / Treatments   Labs (all labs ordered are listed, but only abnormal results are displayed) Labs Reviewed  BASIC METABOLIC PANEL - Abnormal; Notable for the following components:      Result Value   Potassium 3.2 (*)    All other components within normal limits  CBC - Abnormal; Notable for the following components:   Hemoglobin 15.4 (*)    All other components within normal limits  HCG, QUANTITATIVE, PREGNANCY  TSH  I-STAT BETA HCG BLOOD, ED ([place],  [facility], [facility] ONLY)  TROPONIN I (HIGH SENSITIVITY)    EKG EKG Interpretation  Date/Time:  Monday April 25 2022 19:36:53 EDT Ventricular Rate:  65 PR Interval:  132 QRS Duration: 85 QT Interval:  465 QTC Calculation: 484 R Axis:   71 Text Interpretation: Sinus rhythm Atrial premature complexes Probable left atrial enlargement Minimal ST depression, anterolateral leads Confirmed by Virgina Norfolk ([id number]) on 04/25/2022 9:00:31 PM  Radiology DG Chest 2 View  Result Date: 04/25/2022 CLINICAL DATA:  Shortness of breath EXAM: CHEST - 2 VIEW COMPARISON:  07/04/2019 FINDINGS: The heart size and mediastinal contours are within normal limits. Both lungs are clear. The visualized skeletal structures are unremarkable.  Bilateral nipple rings IMPRESSION: No active cardiopulmonary disease. Electronically Signed   By: Jasmine Pang M.D.   On: 04/25/2022 19:36    Procedures Procedures   Medications Ordered in ED Medications  potassium chloride SA (KLOR-CON M) CR tablet 40 mEq (40 mEq Oral Given 04/25/22 2112)    ED Course/ Medical Decision Making/ A&P                           Medical Decision Making Amount and/or Complexity of Data Reviewed Labs: ordered. Radiology: ordered.  Risk Prescription drug management.   36 y/o F presents to the ED for evaluation of bilateral hand numbness and "locking ", chest heaviness, and generalized weakness.  Differential diagnosis includes but is not limited to TSH abnormality, electrolyte abnormality, ACS, arrhythmia, dehydration, stroke, anxiety.  Vital signs show slightly elevated blood pressure otherwise normal.  Physical exam as noted above.  Patient denies any SI or HI.  I independently reviewed and interpreted the patient's labs.  BMP shows slightly decreased potassium 3.2.  Otherwise no electrolyte abnormality.  hCG negative.  Troponin at 2.  CBC shows slightly concentrated hemoglobin of 15.4, normal hematocrit.  No leukocytosis.  Normal TSH.  Magnesium shows 1.7.  Ordered potassium tablet 40 mill equivalents for replenishment.  Chest x-ray shows no active cardiopulmonary disease.  EKG read and interpreted by my attending and interpreted as sinus rhythm Atrial premature complexes Probable left atrial enlargement Minimal ST depression, anterolateral leads.   I doubt any TIA or stroke as the patient has a benign neurological exam.  She reports that she was having some anxiety in the morning and I think her anxiety was exacerbated while at work.  Her potassium was replenished while she was here.  I doubt any ACS given the flat troponins.  Given her EKG changes showing some PACs however, will put an ambulatory referral to cardiology.  I discussed this with the  patient.  We reviewed her labs and imaging.  We discussed return precautions and red flag symptoms.  Patient verbalized understanding and agrees to the plan. The patient is stable and being discharged home in good condition.   I discussed this case with my attending physician who cosigned this note including patient's presenting symptoms, physical exam, and planned diagnostics and interventions. Attending physician stated agreement with plan or made changes to plan which were implemented.   Final Clinical Impression(s) / ED Diagnoses Final diagnoses:  Chest tightness    Rx / DC Orders ED Discharge Orders          Ordered    Ambulatory referral to Cardiology       Comments: If you have not heard from the Cardiology office within the next 72 hours please call [phone].   04/25/22 2203  Achille Rich, PA-C 04/27/22 1527    Virgina Norfolk, DO 04/30/22 [phone]

## 2022-04-25 NOTE — ED Triage Notes (Signed)
Pt BIB EMS from work. Pt endorses feeling anxious and jittery all day. Pt denies hx of anxiety attacks, but had some chest pressure, hyperventilating, and jitteriness. Pt is more relaxed at this time.   BP 178/110 HR 84 99% RA

## 2022-05-02 NOTE — Progress Notes (Deleted)
     Referring-Riley Alison Murray, PA-C Reason for referral-chest pain  HPI: 36 year old female for evaluation of chest pain at request of Achille Rich, PA-C.  Patient seen in the emergency room August 2023 with chest tightness.  Troponins were normal.  Chest x-ray with no acute infiltrate.  Hemoglobin 15.4, creatinine 0.78.  Cardiology now asked to evaluate.  No current outpatient medications on file.   No current facility-administered medications for this visit.    Allergies  Allergen Reactions   Sumatriptan     REACTION: facial burning, constant head movement     Past Medical History:  Diagnosis Date   Tension headache     No past surgical history on file.  Social History   Socioeconomic History   Marital status: Divorced    Spouse name: Not on file   Number of children: Not on file   Years of education: Not on file   Highest education level: Not on file  Occupational History   Not on file  Tobacco Use   Smoking status: Every Day    Packs/day: 0.25    Years: 5.00    Total pack years: 1.25    Types: Cigarettes   Smokeless tobacco: Never  Substance and Sexual Activity   Alcohol use: Yes    Comment: Socially    Drug use: No   Sexual activity: Not on file  Other Topics Concern   Not on file  Social History Narrative   Not on file   Social Determinants of Health   Financial Resource Strain: Not on file  Food Insecurity: Not on file  Transportation Needs: Not on file  Physical Activity: Not on file  Stress: Not on file  Social Connections: Not on file  Intimate Partner Violence: Not on file    No family history on file.  ROS: no fevers or chills, productive cough, hemoptysis, dysphasia, odynophagia, melena, hematochezia, dysuria, hematuria, rash, seizure activity, orthopnea, PND, pedal edema, claudication. Remaining systems are negative.  Physical Exam:   There were no vitals taken for this visit.  General:  Well developed/well nourished in NAD Skin  warm/dry Patient not depressed No peripheral clubbing Back-normal HEENT-normal/normal eyelids Neck supple/normal carotid upstroke bilaterally; no bruits; no JVD; no thyromegaly chest - CTA/ normal expansion CV - RRR/normal S1 and S2; no murmurs, rubs or gallops;  PMI nondisplaced Abdomen -NT/ND, no HSM, no mass, + bowel sounds, no bruit 2+ femoral pulses, no bruits Ext-no edema, chords, 2+ DP Neuro-grossly nonfocal  ECG -April 25, 2022-sinus rhythm with PACs and no ST changes.  Personally reviewed  A/P  1 chest pain-  Olga Millers, MD

## 2022-05-13 ENCOUNTER — Ambulatory Visit: Payer: Medicaid Other | Attending: Cardiology | Admitting: Cardiology

## 2022-09-29 IMAGING — US US PELVIS COMPLETE WITH TRANSVAGINAL
1 series · 14 of 25 positions shown · non-contrast
Comparison: 10/19/2013

CLINICAL DATA: Dysmenorrhea for 2 months, heavy bleeding and pain,
LMP 08/14/2021



[Series 1: us pelvis complete with transvaginal · 0.15mm/px · 14 of 65 slices shown]
[im 1/65]
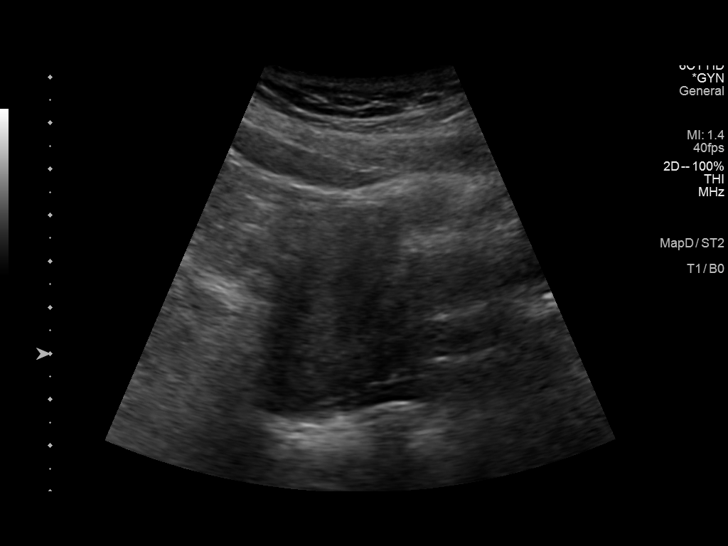
[im 6/65]
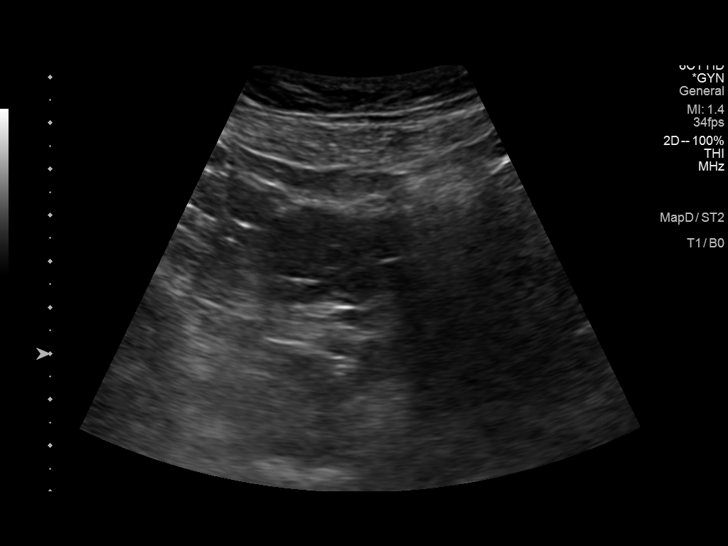
[im 11/65]
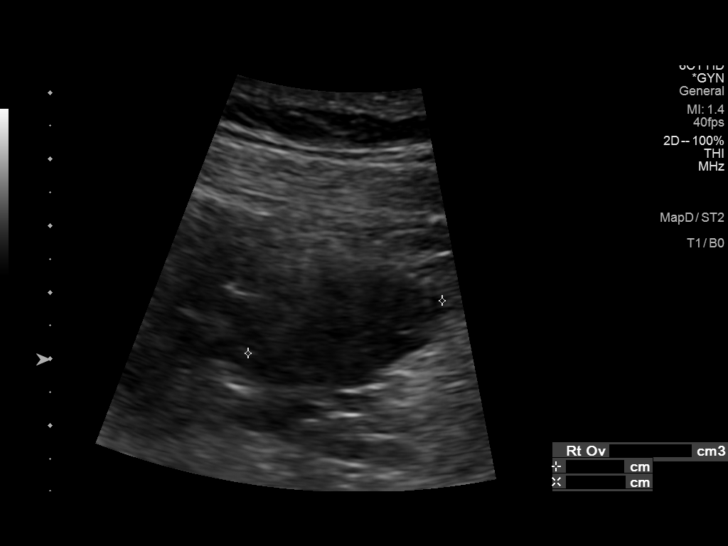
[im 17/65]
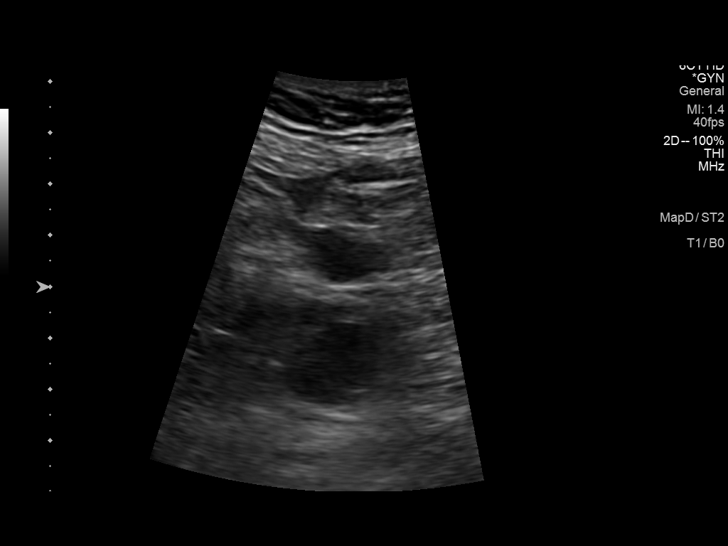
[im 22/65]
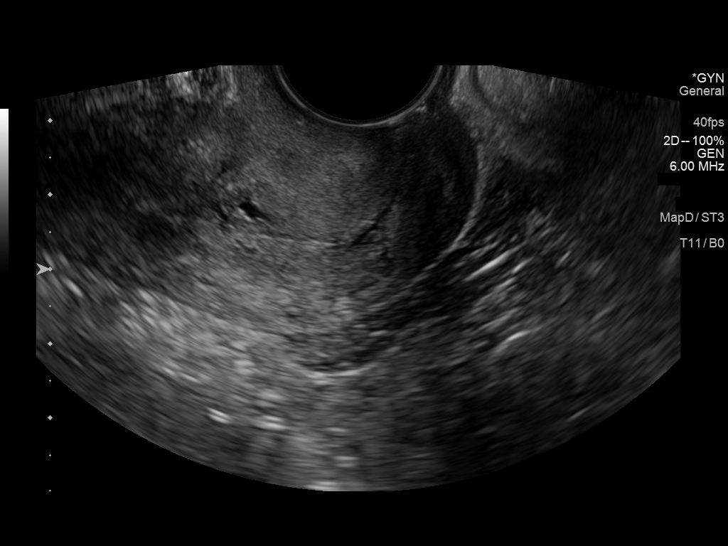
[im 25/65]
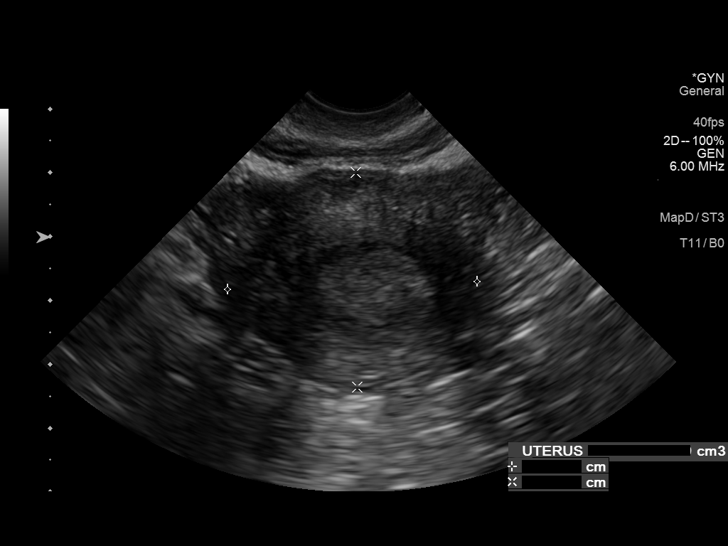
[im 30/65]
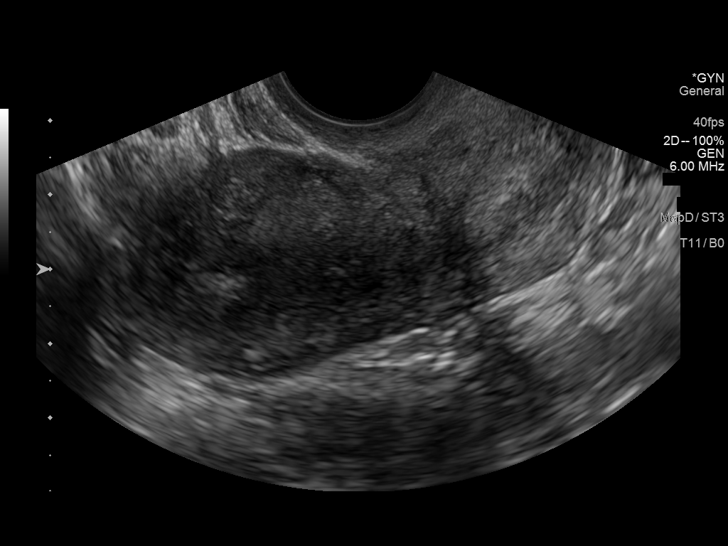
[im 35/65]
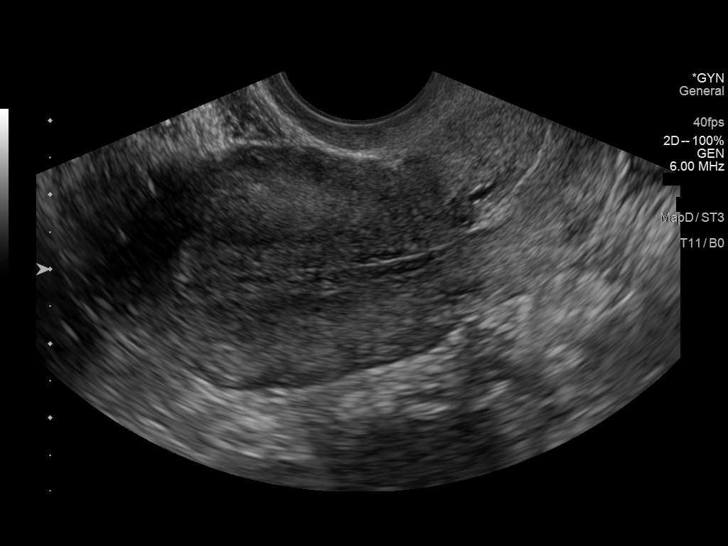
[im 41/65]
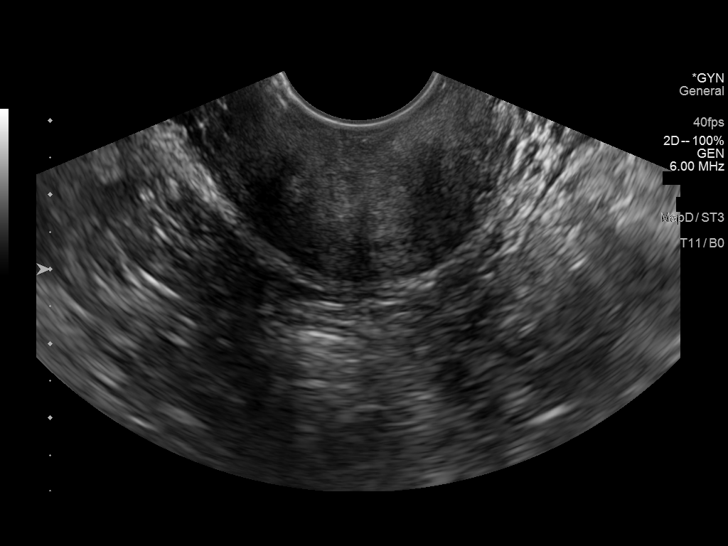
[im 43/65]
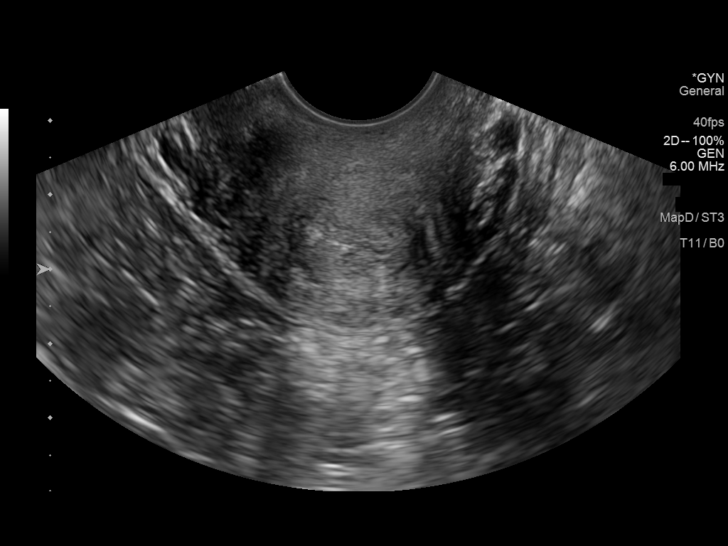
[im 49/65]
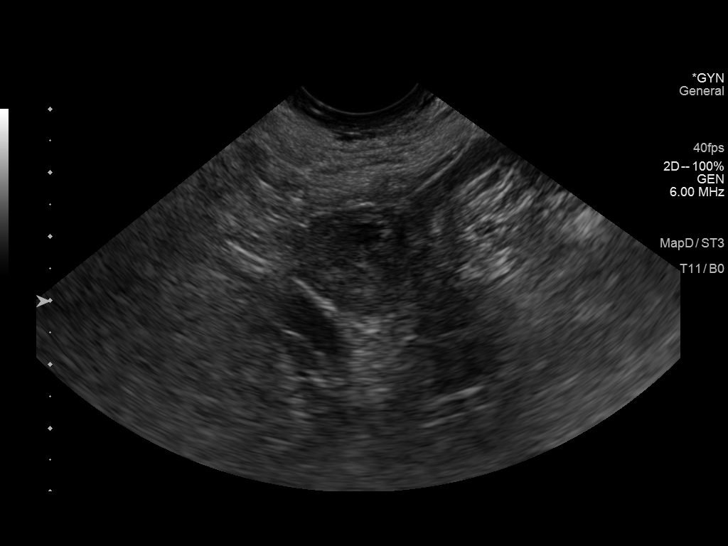
[im 54/65]
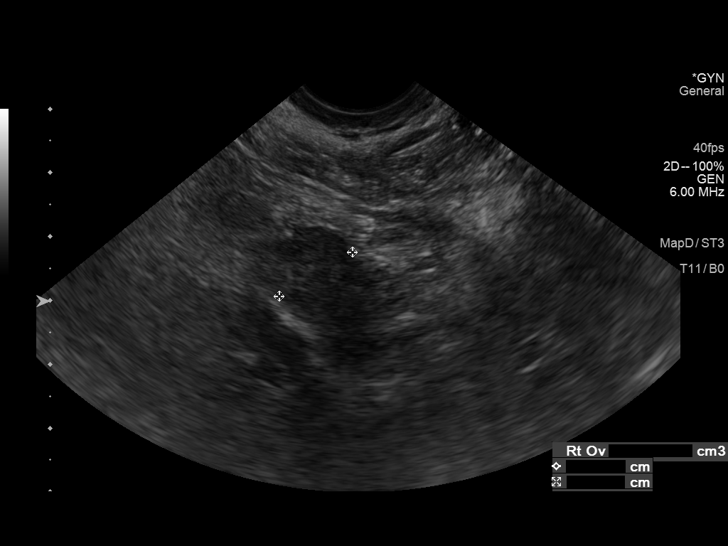
[im 59/65]
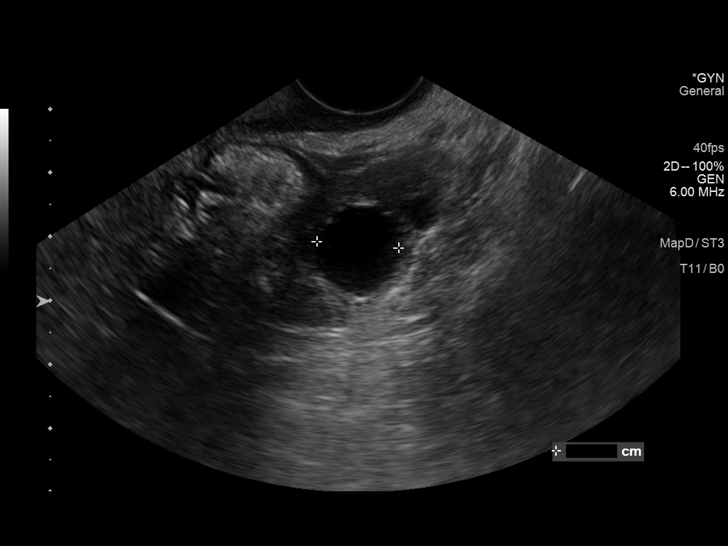
[im 65/65]
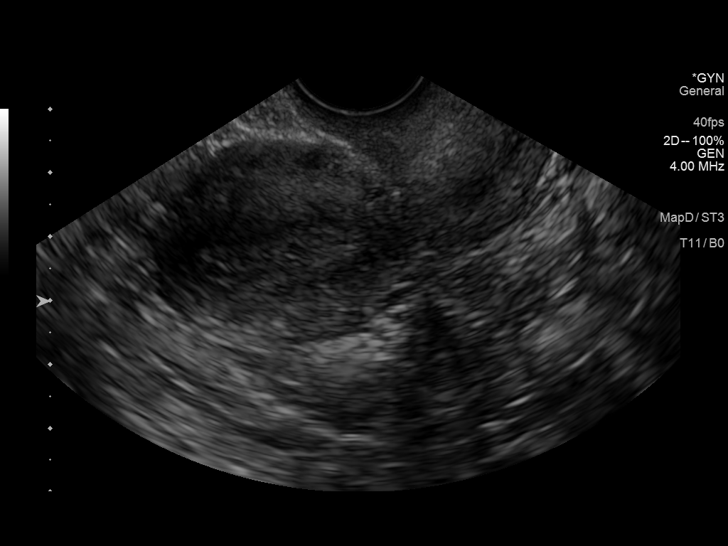

[14 of 25 positions shown; findings below may reference images not displayed]

FINDINGS: Uterus

Measurements: 7.6 x 3.4 x 3.9 cm = volume: 52 mL. Anteverted. Normal
morphology without mass

Endometrium

Thickness: 10 mm.  No endometrial fluid or mass

Right ovary

Measurements: 3.7 x 1.4 x 1.3 cm = volume: 3.5 mL. Normal morphology
without mass

Left ovary

Measurements: 4.5 x 1.7 x 1.9 cm = volume: 7.5 mL. Normal morphology
without mass

Other findings

No free pelvic fluid.  No adnexal masses.
IMPRESSION: Normal exam.

## 2022-11-15 ENCOUNTER — Encounter (HOSPITAL_COMMUNITY): Payer: Self-pay

## 2022-11-15 ENCOUNTER — Ambulatory Visit (HOSPITAL_COMMUNITY)
Admission: EM | Admit: 2022-11-15 | Discharge: 2022-11-15 | Disposition: A | Discharge status: Home / Self Care | Payer: Medicaid Other | Attending: Internal Medicine | Admitting: Internal Medicine

## 2022-11-15 ENCOUNTER — Ambulatory Visit (INDEPENDENT_AMBULATORY_CARE_PROVIDER_SITE_OTHER): Payer: Medicaid Other

## 2022-11-15 ENCOUNTER — Other Ambulatory Visit: Payer: Self-pay

## 2022-11-15 DIAGNOSIS — S92334A Nondisplaced fracture of third metatarsal bone, right foot, initial encounter for closed fracture: Secondary | ICD-10-CM

## 2022-11-15 DIAGNOSIS — W19XXXA Unspecified fall, initial encounter: Secondary | ICD-10-CM | POA: Diagnosis not present

## 2022-11-15 DIAGNOSIS — R600 Localized edema: Secondary | ICD-10-CM | POA: Diagnosis not present

## 2022-11-15 DIAGNOSIS — S92344A Nondisplaced fracture of fourth metatarsal bone, right foot, initial encounter for closed fracture: Secondary | ICD-10-CM

## 2022-11-15 DIAGNOSIS — M79671 Pain in right foot: Secondary | ICD-10-CM | POA: Diagnosis not present

## 2022-11-15 MED ORDER — IBUPROFEN 800 MG PO TABS
ORAL_TABLET | ORAL | Status: AC
  Filled 2022-11-15: qty 1

## 2022-11-15 MED ORDER — IBUPROFEN 800 MG PO TABS
800.0000 mg | ORAL_TABLET | Freq: Once | ORAL | Status: AC
  Administered 2022-11-15: 800 mg via ORAL

## 2022-11-15 NOTE — ED Provider Notes (Signed)
[facility]    CSN: [id number] Arrival date & time: 11/15/22  1349      History   Chief Complaint Chief Complaint  Patient presents with   Foot Injury    HPI Angela Hale is a 37 y.o. female.   Patient presents to urgent care for evaluation of right foot pain and swelling that started yesterday after she tripped over the lip of the sidewalk and folded her foot under "like a lawn chair" yesterday afternoon injuring the dorsal aspect of her right foot.  She states that she iced and elevated her right foot yesterday which helped with the swelling and pain.  She also took ibuprofen and states this helped slightly with inflammation and pain.  Swelling has persisted.  Bruising started this morning to the dorsal aspect of the right foot and the right 5 toes.  She is experiencing significant tenderness to the MTP of the third and fourth digits as well as her last discomfort of across the dorsal aspect of the right foot.  She was also experiencing discomfort medially to the arch of the right foot.  Denies previous injury to the right foot, numbness and tingling distally, and temperature change to the right foot as compared to the left foot.  She is able to bear a small amount of weight on the right foot but reports significant pain when doing so.  No laceration or abrasion.  No pain to the ankle.  She did not hit her head during the fall and was able to remain upright the entire time/did not have to catch herself on her hands.  Last dose of ibuprofen was yesterday.   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 No pertinent surgical  history.  OB History   No obstetric history on file.      Home Medications    Prior to Admission medications   Not on File    Family History Family History  Problem Relation Age of Onset   Healthy Mother    Healthy Father    Healthy Brother     Social History Social History   Tobacco Use   Smoking status: Every Day    Packs/day: 0.25    Years: 5.00    Total pack years: 1.25    Types: Cigarettes   Smokeless tobacco: Never  Substance Use Topics   Alcohol use: Yes    Comment: Socially    Drug use: No     Allergies   Sumatriptan   Review of Systems Review of Systems Per HPI  Physical Exam Triage Vital Signs ED Triage Vitals  Enc Vitals Group     BP 11/15/22 1451 (!) 139/98     Pulse Rate 11/15/22 1451 66     Resp 11/15/22 1451 18     Temp 11/15/22 1451 99.6 F (37.6 C)     Temp Source 11/15/22 1451 Oral     SpO2 11/15/22 1451 98 %     Weight --      Height --      Head Circumference --      Peak Flow --  Pain Score 11/15/22 1452 2     Pain Loc --      Pain Edu? --      Excl. in [place]? --    No data found.  Updated Vital Signs BP (!) 139/98 (BP Location: Left Arm)   Pulse 66   Temp 99.6 F (37.6 C) (Oral)   Resp 18   LMP 11/14/2022   SpO2 98%   Visual Acuity Right Eye Distance:   Left Eye Distance:   Bilateral Distance:    Right Eye Near:   Left Eye Near:    Bilateral Near:     Physical Exam Vitals and nursing note reviewed.  Constitutional:      Appearance: She is not ill-appearing or toxic-appearing.  HENT:     Head: Normocephalic and atraumatic.     Right Ear: Hearing and external ear normal.     Left Ear: Hearing and external ear normal.     Nose: Nose normal.     Mouth/Throat:     Lips: Pink.  Eyes:     General: Lids are normal. Vision grossly intact. Gaze aligned appropriately.     Extraocular Movements: Extraocular movements intact.     Conjunctiva/sclera: Conjunctivae normal.  Cardiovascular:     Rate and  Rhythm: Normal rate and regular rhythm.     Heart sounds: Normal heart sounds, S1 normal and S2 normal.  Pulmonary:     Effort: Pulmonary effort is normal. No respiratory distress.     Breath sounds: Normal breath sounds and air entry.  Musculoskeletal:     Cervical back: Neck supple.       Feet:  Feet:     Right foot:     Skin integrity: Skin integrity normal.     Toenail Condition: Right toenails are normal.     Comments: Diffuse tenderness to palpation and diffuse swelling to the dorsal aspect of the right foot distally.  +2 dorsalis pedis pulse present to the right foot.  Less than 3 capillary refill present distal to injury.  Range of motion to the right ankle is normal.  No tenderness to palpation of her ankle.  No laceration/abrasion. Significant discomfort with weight bearing to the RLE. Sensation and strength intact to RLE.  Skin:    General: Skin is warm and dry.     Capillary Refill: Capillary refill takes less than 2 seconds.     Findings: No rash.  Neurological:     General: No focal deficit present.     Mental Status: She is alert and oriented to person, place, and time. Mental status is at baseline.     Cranial Nerves: No dysarthria or facial asymmetry.  Psychiatric:        Mood and Affect: Mood normal.        Speech: Speech normal.        Behavior: Behavior normal.        Thought Content: Thought content normal.        Judgment: Judgment normal.      UC Treatments / Results  Labs (all labs ordered are listed, but only abnormal results are displayed) Labs Reviewed - No data to display  EKG   Radiology DG Foot Complete Right  Result Date: 11/15/2022 CLINICAL DATA:  Trip and fall yesterday EXAM: RIGHT FOOT COMPLETE - 3+ VIEW COMPARISON:  None Available. FINDINGS: Suspect very subtle nondisplaced fractures of the right third and fourth metatarsals, seen only on lateral view. There is no evidence of arthropathy or other  focal bone abnormality. Diffuse soft tissue  edema about the forefoot. IMPRESSION: 1. Suspect very subtle nondisplaced fractures of the right third and fourth metatarsals, seen only on lateral view. Consider CT to further evaluate. 2. Diffuse soft tissue edema about the forefoot. Electronically Signed   By: Delanna Ahmadi M.D.   On: 11/15/2022 15:26    Procedures Procedures (including critical care time)  Medications Ordered in UC Medications  ibuprofen (ADVIL) tablet 800 mg (800 mg Oral Given 11/15/22 1533)    Initial Impression / Assessment and Plan / UC Course  I have reviewed the triage vital signs and the nursing notes.  Pertinent labs & imaging results that were available during my care of the patient were reviewed by me and considered in my medical decision making (see chart for details).   1.  Closed fracture of the third and fourth metatarsal of the bone of the right foot X-ray confirms fractures to the third and fourth metatarsals of the right foot.  Recommend CT to confirm due to difficulty to visualize on lateral imaging.  Notification for this to be done today as patient is neurovascularly intact distal to injury.  Placed in cam walker boot, advised to wear this until her follow-up appointment with orthopedics.  Given ibuprofen 800 mg in clinic, may repeat this at home every 8 hours as needed for pain and swelling.  RICE advised.  Crutches given for ambulation.  Advised nonweightbearing until able to see orthopedics.  Walking referral to [facility] orthopedics provided.  Strict ER and urgent care return precautions discussed.  She is agreeable with this plan.  Work note given.   Discussed physical exam and available lab work findings in clinic with patient.  Counseled patient regarding appropriate use of medications and potential side effects for all medications recommended or prescribed today. Discussed red flag signs and symptoms of worsening condition,when to call the PCP office, return to urgent care, and when to seek higher  level of care in the emergency department. Patient verbalizes understanding and agreement with plan. All questions answered. Patient discharged in stable condition.    Final Clinical Impressions(s) / UC Diagnoses   Final diagnoses:  Closed nondisplaced fracture of third metatarsal bone of right foot, initial encounter  Closed nondisplaced fracture of fourth metatarsal bone of right foot, initial encounter     Discharge Instructions      You have been evaluated for foot pain today. Your evaluation showed a fracture of your 3rd and 4th foot bones. We have placed your foot in a CAM walker boot today. Wear this all the time. Use crutches to walk.  Rest, ice, elevate, and compress the injury to reduce swelling and inflammation.   Please take ibuprofen '800mg'$  every 8 hours and/or tylenol regular strength '650mg'$  every 6 hours as needed with food for pain. You may purchase these medications over the counter (4 '200mg'$  ibuprofen pills= '800mg'$ ).   If you still have pain despite taking ibuprofen regularly, this is called breakthrough pain.  You can use hydrocodone, a narcotic pain medicine, once every 4-6 hours for this.  Once your pain is better controlled, switch back to just ibuprofen/tylenol.  Avoid use of extra strength tylenol while taking hydrocodone since this medication already has tylenol in it.   Schedule an appointment with the orthopedic provider listed on your paperwork for follow-up in the next 3-5 days.  Return if you experience worsening pain, numbness, tingling, skin color changes, or any other concerning symptoms. If symptoms are severe,  please go to the ER. I hope you feel better!!     ED Prescriptions   None    PDMP not reviewed this encounter.   Talbot Grumbling, [place] 11/15/22 1601

## 2022-11-15 NOTE — Discharge Instructions (Signed)
You have been evaluated for foot pain today. Your evaluation showed a fracture of your 3rd and 4th foot bones. We have placed your foot in a CAM walker boot today. Wear this all the time. Use crutches to walk.  Rest, ice, elevate, and compress the injury to reduce swelling and inflammation.   Please take ibuprofen '800mg'$  every 8 hours and/or tylenol regular strength '650mg'$  every 6 hours as needed with food for pain. You may purchase these medications over the counter (4 '200mg'$  ibuprofen pills= '800mg'$ ).   If you still have pain despite taking ibuprofen regularly, this is called breakthrough pain.  You can use hydrocodone, a narcotic pain medicine, once every 4-6 hours for this.  Once your pain is better controlled, switch back to just ibuprofen/tylenol.  Avoid use of extra strength tylenol while taking hydrocodone since this medication already has tylenol in it.   Schedule an appointment with the orthopedic provider listed on your paperwork for follow-up in the next 3-5 days.  Return if you experience worsening pain, numbness, tingling, skin color changes, or any other concerning symptoms. If symptoms are severe, please go to the ER. I hope you feel better!!

## 2022-11-15 NOTE — ED Triage Notes (Addendum)
Pt states tripped over a lip of a sidewalk and folded her rt foot under yesterday afternoon. States has elevated, iced, and took ibuprofen for pain. States able to pul a little weight on it with pain. Swelling noted.  Pedal pulse marked.
